# Patient Record
Sex: Male | Born: 1980 | Hispanic: Refuse to answer | Marital: Married | Smoking: Former smoker
Health system: Southern US, Community
[De-identification: ages and names within clinical notes are randomized; demographics above are authoritative.]

## PROBLEM LIST (undated history)

## (undated) DIAGNOSIS — F32A Depression, unspecified: Secondary | ICD-10-CM

## (undated) DIAGNOSIS — F4310 Post-traumatic stress disorder, unspecified: Secondary | ICD-10-CM

## (undated) DIAGNOSIS — D6851 Activated protein C resistance: Secondary | ICD-10-CM

## (undated) DIAGNOSIS — E669 Obesity, unspecified: Secondary | ICD-10-CM

## (undated) DIAGNOSIS — E785 Hyperlipidemia, unspecified: Secondary | ICD-10-CM

## (undated) DIAGNOSIS — F909 Attention-deficit hyperactivity disorder, unspecified type: Secondary | ICD-10-CM

## (undated) DIAGNOSIS — G4482 Headache associated with sexual activity: Secondary | ICD-10-CM

## (undated) HISTORY — DX: Depression, unspecified: F32.A

## (undated) HISTORY — DX: Activated protein C resistance: D68.51

## (undated) HISTORY — DX: Obesity, unspecified: E66.9

## (undated) HISTORY — DX: Headache associated with sexual activity: G44.82

## (undated) HISTORY — DX: Post-traumatic stress disorder, unspecified: F43.10

## (undated) HISTORY — DX: Hyperlipidemia, unspecified: E78.5

## (undated) HISTORY — DX: Attention-deficit hyperactivity disorder, unspecified type: F90.9

---

## 2011-07-14 ENCOUNTER — Encounter: Payer: Self-pay | Admitting: Internal Medicine

## 2011-07-14 ENCOUNTER — Other Ambulatory Visit: Payer: Self-pay | Admitting: Internal Medicine

## 2011-07-14 ENCOUNTER — Ambulatory Visit (INDEPENDENT_AMBULATORY_CARE_PROVIDER_SITE_OTHER): Payer: PRIVATE HEALTH INSURANCE | Admitting: Internal Medicine

## 2011-07-14 ENCOUNTER — Other Ambulatory Visit (INDEPENDENT_AMBULATORY_CARE_PROVIDER_SITE_OTHER): Payer: PRIVATE HEALTH INSURANCE

## 2011-07-14 VITALS — BP 130/80 | HR 72 | Temp 98.6°F | Resp 16 | Ht 68.5 in | Wt 351.0 lb

## 2011-07-14 DIAGNOSIS — D6851 Activated protein C resistance: Secondary | ICD-10-CM

## 2011-07-14 DIAGNOSIS — Z136 Encounter for screening for cardiovascular disorders: Secondary | ICD-10-CM | POA: Insufficient documentation

## 2011-07-14 DIAGNOSIS — E669 Obesity, unspecified: Secondary | ICD-10-CM

## 2011-07-14 DIAGNOSIS — R0683 Snoring: Secondary | ICD-10-CM

## 2011-07-14 DIAGNOSIS — Z0000 Encounter for general adult medical examination without abnormal findings: Secondary | ICD-10-CM

## 2011-07-14 DIAGNOSIS — D6859 Other primary thrombophilia: Secondary | ICD-10-CM

## 2011-07-14 LAB — COMPREHENSIVE METABOLIC PANEL
ALT: 53 U/L (ref 0–53)
AST: 32 U/L (ref 0–37)
CO2: 29 mEq/L (ref 19–32)
GFR: 124.34 mL/min (ref >60.00)
Sodium: 140 mEq/L (ref 135–145)
Total Bilirubin: 0.7 mg/dL (ref 0.3–1.2)
Total Protein: 7.6 g/dL (ref 6.0–8.3)

## 2011-07-14 LAB — CBC WITH DIFFERENTIAL/PLATELET
Basophils Relative: 0.8 % (ref 0.0–3.0)
Eosinophils Relative: 0.8 % (ref 0.0–5.0)
HCT: 42.9 % (ref 39.0–52.0)
Lymphs Abs: 3 10*3/uL (ref 0.7–4.0)
MCV: 88.8 fl (ref 78.0–100.0)
Monocytes Absolute: 0.7 10*3/uL (ref 0.1–1.0)
Neutrophils Relative %: 50.9 % (ref 43.0–77.0)
RBC: 4.83 Mil/uL (ref 4.22–5.81)
WBC: 7.7 10*3/uL (ref 4.5–10.5)

## 2011-07-14 LAB — HEMOGLOBIN A1C: Hgb A1c MFr Bld: 5.6 % (ref 4.6–6.5)

## 2011-07-14 LAB — LIPID PANEL
Total CHOL/HDL Ratio: 4
Triglycerides: 373 mg/dL — ABNORMAL HIGH (ref 0.0–149.0)

## 2011-07-14 NOTE — Progress Notes (Signed)
Subjective:    Patient ID: Luis Bowen, male    DOB: 04/24/1981, 30 y.o.   MRN: [id number]  HPI  New to me for a complete physical but he also complains of heavy snoring, weight gain, chronic headache, and "narcolepsy". He wants to be tested for Factor 5 leiden due to his mother's history.  Review of Systems  Constitutional: Positive for fatigue and unexpected weight change (weight gain). Negative for fever, chills, diaphoresis, activity change and appetite change.  HENT: Negative for sore throat, facial swelling, trouble swallowing, neck pain, neck stiffness and voice change.   Eyes: Negative for photophobia, redness and visual disturbance.  Respiratory: Positive for apnea. Negative for cough, choking, chest tightness, shortness of breath, wheezing and stridor.   Cardiovascular: Negative for chest pain, palpitations and leg swelling.  Gastrointestinal: Negative for nausea, abdominal pain, diarrhea, constipation, blood in stool, abdominal distention, anal bleeding and rectal pain.  Genitourinary: Negative for dysuria, urgency, frequency, hematuria, flank pain, decreased urine volume, enuresis and difficulty urinating.  Musculoskeletal: Negative for myalgias, back pain, joint swelling, arthralgias and gait problem.  Skin: Negative for color change, pallor and rash.  Neurological: Negative for dizziness, tremors, seizures, syncope, facial asymmetry, speech difficulty, weakness, light-headedness, numbness and headaches.  Hematological: Negative for adenopathy. Does not bruise/bleed easily.  Psychiatric/Behavioral: Negative for suicidal ideas, hallucinations, behavioral problems, confusion, sleep disturbance, self-injury, dysphoric mood, decreased concentration and agitation. The patient is not nervous/anxious and is not hyperactive.        Objective:   Physical Exam  Vitals reviewed. Constitutional: He is oriented to person, place, and time. He appears well-developed and well-nourished. No  distress.  HENT:  Head: Normocephalic and atraumatic.  Right Ear: External ear normal.  Left Ear: External ear normal.  Nose: Nose normal.  Mouth/Throat: Oropharynx is clear and moist. No oropharyngeal exudate.  Eyes: Conjunctivae and EOM are normal. Pupils are equal, round, and reactive to light. Right eye exhibits no discharge. Left eye exhibits no discharge. No scleral icterus.  Neck: Normal range of motion. Neck supple. No JVD present. No tracheal deviation present. No thyromegaly present.  Cardiovascular: Normal rate, regular rhythm, normal heart sounds and intact distal pulses.  Exam reveals no gallop and no friction rub.   No murmur heard. Pulmonary/Chest: Effort normal and breath sounds normal. No stridor. No respiratory distress. He has no wheezes. He has no rales. He exhibits no tenderness.  Abdominal: Soft. Bowel sounds are normal. He exhibits no distension and no mass. There is no tenderness. There is no rebound and no guarding. Hernia confirmed negative in the right inguinal area.  Genitourinary: Testes normal and penis normal. Right testis shows no mass, no swelling and no tenderness. Right testis is descended. Cremasteric reflex is not absent on the right side. Left testis shows no mass, no swelling and no tenderness. Left testis is descended. Cremasteric reflex is not absent on the left side. Circumcised. No penile erythema or penile tenderness. No discharge found.  Musculoskeletal: Normal range of motion. He exhibits no edema and no tenderness.  Lymphadenopathy:    He has no cervical adenopathy.       Right: No inguinal adenopathy present.       Left: No inguinal adenopathy present.  Neurological: He is alert and oriented to person, place, and time. He has normal reflexes. He displays normal reflexes. No cranial nerve deficit. He exhibits normal muscle tone. Coordination normal.  Skin: Skin is warm and dry. No rash noted. He is not diaphoretic. No erythema. No  pallor.    Psychiatric: He has a normal mood and affect. His behavior is normal. Judgment and thought content normal.          Assessment & Plan:

## 2011-07-14 NOTE — Patient Instructions (Signed)
Health Maintenance in Males MAINTAIN REGULAR HEALTH EXAMS  Maintain a healthy diet and normal weight. Increased weight leads to problems with blood pressure and diabetes. Decrease fat in the diet and increase exercise. Obtain a proper diet from your caregiver if necessary.   High blood pressure causes heart and blood vessel problems. Check blood pressures regularly and keep your blood pressure at normal limits. Aerobic exercise helps this. Persistent elevations of blood pressure should be treated with medications if weight loss and exercise are ineffective.   Avoid smoking, drinking in excess (more than 2 drinks per day), or use of street drugs. Do not share needles with anyone. Ask for help if you need assistance or instructions on stopping the use of alcohol, cigarettes, or drugs.   Maintain normal blood lipids and cholesterol. Your caregiver can give you information to lower your risk of heart disease or stroke.   Ask your caregiver if you are in need of early heart disease screening because of a strong family history of heart disease or signs of elevated testosterone (male sex hormone) levels. These can predispose you to early heart disease.   Practice safe sex. Practicing safe sex decreases your risk for a sexually transmitted infection (STI). Some of the STIs are gonorrhea, chlamydia, syphilis, trichimonas, herpes, human papillomavirus (HPV), and human immunodeficiency virus (HIV). Herpes, HIV, and HPV are viral illnesses that have no cure. These can result in disability, cancer, and death.   It is not safe for someone who has AIDS or is HIV positive to have unprotected sex with a partner who is HIV positive. The reason for this is the fact that there are many different strains of HIV. If you have a strain that is readily treated with medications and then suddenly introduce a strain from a partner that has no further treatment options, you may suddenly have a strain of HIV that is untreatable.  Even if you are both positive for HIV, it is still necessary to practice safe sex.   Use sunscreen with a SPF of 15 or greater. Being outside in the sun when your shadow caused by the sun is shorter than you are, means you are being exposed to sun at greater intensity. Lighter skinned people are at a greater risk of skin cancer.   Keep carbon monoxide and smoke detectors in your home and functioning at all times. Change the batteries every 6 months.   Do monthly examinations of your testicles. The best time to do this is after a hot shower or bath when the tissues are loose. Notify your caregivers of any lumps, tenderness, or changes in size or shape.   Notify your caregiver of new moles or changes in moles, especially if there is a change in shape or color. Also notify your caregiver if a mole is larger than the size of a pencil eraser.   Stay current with your tetanus shots and other required immunizations.  The Body Mass Index (BMI) is a way of measuring how much of your body is fat. Having a BMI above 27 increases the risk of heart disease, diabetes, hypertension, stroke, and other problems related to obesity. Document Released: 05/11/2008 Document Re-Released: 05/03/2010 [employer] Patient Information 2011 [employer]. 

## 2011-07-14 NOTE — Assessment & Plan Note (Signed)
Test ordered

## 2011-07-14 NOTE — Assessment & Plan Note (Signed)
He has been referred for sleep eval

## 2011-07-14 NOTE — Assessment & Plan Note (Signed)
Exam done, labs ordered, EKG is normal, pt ed material was given

## 2011-07-17 ENCOUNTER — Encounter: Payer: Self-pay | Admitting: Internal Medicine

## 2011-07-19 LAB — FACTOR 5 LEIDEN

## 2011-07-20 ENCOUNTER — Encounter: Payer: Self-pay | Admitting: Internal Medicine

## 2011-08-04 ENCOUNTER — Encounter: Payer: Self-pay | Admitting: Pulmonary Disease

## 2011-08-04 ENCOUNTER — Ambulatory Visit (INDEPENDENT_AMBULATORY_CARE_PROVIDER_SITE_OTHER): Payer: PRIVATE HEALTH INSURANCE | Admitting: Pulmonary Disease

## 2011-08-04 DIAGNOSIS — G4710 Hypersomnia, unspecified: Secondary | ICD-10-CM

## 2011-08-04 DIAGNOSIS — R0989 Other specified symptoms and signs involving the circulatory and respiratory systems: Secondary | ICD-10-CM

## 2011-08-04 DIAGNOSIS — R0683 Snoring: Secondary | ICD-10-CM

## 2011-08-04 DIAGNOSIS — R0609 Other forms of dyspnea: Secondary | ICD-10-CM

## 2011-08-04 NOTE — Patient Instructions (Signed)
Will set up for sleep study.  Will call once results available so we can review. Work on weight reduction.

## 2011-08-04 NOTE — Assessment & Plan Note (Signed)
The patient's history is classic for significant obstructive sleep apnea.  He has loud snoring with an abnormal breathing pattern, non-restorative sleep, and finally very significant daytime sleepiness.  He is obese and has abnormal upper airway anatomy.  I have had a long discussion with the pt about sleep apnea, including its impact on QOL and CV health.  The patient needs a sleep study at this point, and he is agreeable.  I have also encouraged him to work aggressively on weight loss.

## 2011-08-04 NOTE — Progress Notes (Signed)
  Subjective:    Patient ID: Luis Bowen, male    DOB: [birth year], 30 y.o.   MRN: [id number]  HPI The pt is a 30y/o male who is referred for evaluation of possible osa.  The patient's primary complaint is that of significant daytime hypersomnia.  He has been noted to have loud snoring while sleeping, as well as an abnormal breathing pattern.  He has frequent awakenings at night, and nonrestorative sleep.  The patient states that he has significant sleep pressure during the day with any period of inactivity.  He is falling asleep at work, and his job performance is being affected.  Patient also notes he will fall asleep easily while watching TV or movies in the evening, and has significant sleepiness while driving.  He states his weight is up about 20 pounds over the last 2 years, and his Epworth sleepiness score today is 24.  Sleep Questionnaire: What time do you typically go to bed?( Between what hours) 9 to 10 pm How long does it take you to fall asleep? 1 to 2 mins How many times during the night do you wake up? 1 What time do you get out of bed to start your day? 0600 Do you drive or operate heavy machinery in your occupation? No How much has your weight changed (up or down) over the past two years? (In pounds) 20 lb (9.072 kg) Have you ever had a sleep study before? No Do you currently use CPAP? No Do you wear oxygen at any time? No    Review of Systems  Constitutional: Negative for fever and unexpected weight change.  HENT: Negative for ear pain, nosebleeds, congestion, sore throat, rhinorrhea, sneezing, trouble swallowing, dental problem, postnasal drip and sinus pressure.   Eyes: Negative for redness and itching.  Respiratory: Negative for cough, chest tightness, shortness of breath and wheezing.   Cardiovascular: Negative for palpitations and leg swelling.  Gastrointestinal: Negative for nausea and vomiting.  Genitourinary: Negative for dysuria.  Musculoskeletal: Negative for joint  swelling.  Skin: Negative for rash.  Neurological: Negative for headaches.  Hematological: Does not bruise/bleed easily.  Psychiatric/Behavioral: Negative for dysphoric mood. The patient is nervous/anxious.        Objective:   Physical Exam Constitutional:  Obese male, no acute distress  HENT:  Nares patent without discharge, but enlarged turbs.  Oropharynx without exudate, palate and uvula are thick and elongated.  Significant tonsillar hypertrophy.   Eyes:  Perrla, eomi, no scleral icterus  Neck:  No JVD, no TMG  Cardiovascular:  Normal rate, regular rhythm, no rubs or gallops.  No murmurs        Intact distal pulses  Pulmonary :  Normal breath sounds, no stridor or respiratory distress   No rales, rhonchi, or wheezing  Abdominal:  Soft, nondistended, bowel sounds present.  No tenderness noted.   Musculoskeletal:  mild lower extremity edema noted.  Lymph Nodes:  No cervical lymphadenopathy noted  Skin:  No cyanosis noted  Neurologic:  Alert, appropriate, moves all 4 extremities without obvious deficit.         Assessment & Plan:

## 2011-08-08 ENCOUNTER — Ambulatory Visit (INDEPENDENT_AMBULATORY_CARE_PROVIDER_SITE_OTHER): Payer: PRIVATE HEALTH INSURANCE | Admitting: Pulmonary Disease

## 2011-08-08 DIAGNOSIS — G4710 Hypersomnia, unspecified: Secondary | ICD-10-CM

## 2011-08-08 DIAGNOSIS — R0683 Snoring: Secondary | ICD-10-CM

## 2011-08-08 DIAGNOSIS — G4733 Obstructive sleep apnea (adult) (pediatric): Secondary | ICD-10-CM

## 2011-08-14 ENCOUNTER — Encounter: Payer: Self-pay | Admitting: Internal Medicine

## 2011-08-14 ENCOUNTER — Ambulatory Visit (INDEPENDENT_AMBULATORY_CARE_PROVIDER_SITE_OTHER): Payer: PRIVATE HEALTH INSURANCE | Admitting: Internal Medicine

## 2011-08-14 VITALS — BP 118/76 | HR 64 | Temp 98.4°F | Resp 16 | Wt 350.0 lb

## 2011-08-14 DIAGNOSIS — D6859 Other primary thrombophilia: Secondary | ICD-10-CM

## 2011-08-14 DIAGNOSIS — E781 Pure hyperglyceridemia: Secondary | ICD-10-CM | POA: Insufficient documentation

## 2011-08-14 DIAGNOSIS — D6851 Activated protein C resistance: Secondary | ICD-10-CM

## 2011-08-14 MED ORDER — FENOFIBRATE 150 MG PO CAPS: 1.0000 | ORAL_CAPSULE | Freq: Every day | ORAL | Status: DC

## 2011-08-14 NOTE — Assessment & Plan Note (Signed)
Start lipofen and continue to work on lifestyle modifications

## 2011-08-14 NOTE — Patient Instructions (Signed)
Hypertriglyceridemia    Diet for High blood levels of Triglycerides  Most fats in food are triglycerides. Triglycerides in your blood are stored as fat in your body. High levels of triglycerides in your blood may put you at a greater risk for heart disease and stroke.    Normal triglyceride levels are less than 150 mg/dL. Borderline high levels are 150-199 mg/dl. High levels are 200 - 499 mg/dL, and very high triglyceride levels are greater than 500 mg/dL. The decision to treat high triglycerides is generally based on the level. For people with borderline or high triglyceride levels, treatment includes weight loss and exercise. Drugs are recommended for people with very high triglyceride levels.  Many people who need treatment for high triglyceride levels have metabolic syndrome. This syndrome is a collection of disorders that often include: insulin resistance, high blood pressure, blood clotting problems, high cholesterol and triglycerides.  TESTING PROCEDURE FOR TRIGLYCERIDES   You should not eat 4 hours before getting your triglycerides measured. The normal range of triglycerides is between 10 and 250 milligrams per deciliter (mg/dl). Some people may have extreme levels (1000 or above), but your triglyceride level may be too high if it is above 150 mg/dl, depending on what other risk factors you have for heart disease.    People with high blood triglycerides may also have high blood cholesterol levels. If you have high blood cholesterol as well as high blood triglycerides, your risk for heart disease is probably greater than if you only had high triglycerides. High blood cholesterol is one of the main risk factors for heart disease.   CHANGING YOUR DIET     Your weight can affect your blood triglyceride level. If you are more than 20% above your ideal body weight, you may be able to lower your blood triglycerides by losing weight. Eating less and exercising regularly is the best way to combat this. Fat provides more calories than any other food. The best way to lose weight is to eat less fat. Only 30% of your total calories should come from fat. Less than 7% of your diet should come from saturated fat. A diet low in fat and saturated fat is the same as a diet to decrease blood cholesterol. By eating a diet lower in fat, you may lose weight, lower your blood cholesterol, and lower your blood triglyceride level.    Eating a diet low in fat, especially saturated fat, may also help you lower your blood triglyceride level. Ask your dietitian to help you figure how much fat you can eat based on the number of calories your caregiver has prescribed for you.    Exercise, in addition to helping with weight loss may also help lower triglyceride levels.    Alcohol can increase blood triglycerides. You may need to stop drinking alcoholic beverages.    Too much carbohydrate in your diet may also increase your blood triglycerides. Some complex carbohydrates are necessary in your diet. These may include bread, rice, potatoes, other starchy vegetables and cereals.    Reduce "simple" carbohydrates. These may include pure sugars, candy, honey, and jelly without losing other nutrients. If you have the kind of high blood triglycerides that is affected by the amount of carbohydrates in your diet, you will need to eat less sugar and less high-sugar foods. Your caregiver can help you with this.    Adding 2-4 grams of fish oil (EPA+ DHA) may also help lower triglycerides. Speak with your caregiver before adding any supplements to   your regimen.   Following the Diet    Maintain your ideal weight. Your caregivers can help you with a diet. Generally, eating less food and getting more exercise will help you lose weight. Joining a weight control group may also help. Ask your caregivers for a good weight control group in your area.    Eat low-fat foods instead of high-fat foods. This can help you lose weight too.    These foods are lower in fat. Eat MORE of these:    Dried beans, peas, and lentils.      Egg whites.      Low-fat cottage cheese.      Fish.     Lean cuts of meat, such as round, sirloin, rump, and flank (cut extra fat off meat you fix).     Whole grain breads, cereals and pasta.      Skim and nonfat dry milk.      Low-fat yogurt.      Poultry without the skin.      Cheese made with skim or part-skim milk, such as mozzarella, parmesan, farmers', ricotta, or pot cheese.      These are higher fat foods. Eat LESS of these:    Whole milk and foods made from whole milk, such as American, blue, cheddar, monterey jack, and swiss cheese    High-fat meats, such as luncheon meats, sausages, knockwurst, bratwurst, hot dogs, ribs, corned beef, ground pork, and regular ground beef.    Fried foods.   Limit saturated fats in your diet. Substituting unsaturated fat for saturated fat may decrease your blood triglyceride level. You will need to read package labels to know which products contain saturated fats.    These foods are high in saturated fat. Eat LESS of these:    Fried pork skins.    Whole milk.      Skin and fat from poultry.      Palm oil.      Butter.     Shortening.     Cream cheese.      Bacon.     Margarines and baked goods made from listed oils.      Vegetable shortenings.     Chitterlings.    Fat from meats.      Coconut oil.      Palm kernel oil.      Lard.     Cream.     Sour cream.      Fatback.     Coffee whiteners and non-dairy creamers made with these oils.      Cheese made from whole milk.       Use unsaturated fats (both polyunsaturated and monounsaturated) moderately. Remember, even though unsaturated fats are better than saturated fats; you still want a diet low in total fat.    These foods are high in unsaturated fat:    Canola oil.    Sunflower oil.      Mayonnaise.     Almonds.     Peanuts.     Pine nuts.      Margarines made with these oils.     Safflower oil.    Olive oil.      Avocados.     Cashews.     Peanut butter.      Sunflower seeds.     Soybean oil.    Peanut oil.      Olives.     Pecans.     Walnuts.     Pumpkin seeds.        Avoid sugar and other high-sugar foods. This will decrease carbohydrates without decreasing other nutrients. Sugar in your food goes rapidly to your blood. When there is excess sugar in your blood, your liver may use it to make more triglycerides. Sugar also contains calories without other important nutrients.    Eat LESS of these:    Sugar, brown sugar, powdered sugar, jam, jelly, preserves, honey, syrup, molasses, pies, candy, cakes, cookies, frosting, pastries, colas, soft drinks, punches, fruit drinks, and regular gelatin.    Avoid alcohol. Alcohol, even more than sugar, may increase blood triglycerides. In addition, alcohol is high in calories and low in nutrients. Ask for sparkling water, or a diet soft drink instead of an alcoholic beverage.   Suggestions for planning and preparing meals    Bake, broil, grill or roast meats instead of frying.    Remove fat from meats and skin from poultry before cooking.    Add spices, herbs, lemon juice or vinegar to vegetables instead of salt, rich sauces or gravies.    Use a non-stick skillet without fat or use no-stick sprays.    Cool and refrigerate stews and broth. Then remove the hardened fat floating on the surface before serving.    Refrigerate meat drippings and skim off fat to make low-fat gravies.    Serve more fish.     Use less butter, margarine and other high-fat spreads on bread or vegetables.    Use skim or reconstituted non-fat dry milk for cooking.    Cook with low-fat cheeses.    Substitute low-fat yogurt or cottage cheese for all or part of the sour cream in recipes for sauces, dips or congealed salads.    Use half yogurt/half mayonnaise in salad recipes.    Substitute evaporated skim milk for cream. Evaporated skim milk or reconstituted non-fat dry milk can be whipped and substituted for whipped cream in certain recipes.    Choose fresh fruits for dessert instead of high-fat foods such as pies or cakes. Fruits are naturally low in fat.   When Dining Out    Order low-fat appetizers such as fruit or vegetable juice, pasta with vegetables or tomato sauce.    Select clear, rather than cream soups.    Ask that dressings and gravies be served on the side. Then use less of them.    Order foods that are baked, broiled, poached, steamed, stir-fried, or roasted.    Ask for margarine instead of butter, and use only a small amount.    Drink sparkling water, unsweetened tea or coffee, or diet soft drinks instead of alcohol or other sweet beverages.   QUESTIONS AND ANSWERS ABOUT OTHER FATS IN THE BLOOD:   SATURATED FAT, TRANS FAT, AND CHOLESTEROL  What is trans fat?  Trans fat is a type of fat that is formed when vegetable oil is hardened through a process called hydrogenation. This process helps makes foods more solid, gives them shape, and prolongs their shelf life. Trans fats are also called hydrogenated or partially hydrogenated oils.    What do saturated fat, trans fat, and cholesterol in foods have to do with heart disease?  Saturated fat, trans fat, and cholesterol in the diet all raise the level of LDL "bad" cholesterol in the blood. The higher the LDL cholesterol, the greater the risk for coronary heart disease (CHD). Saturated fat and trans fat raise LDL similarly.     What foods contain saturated fat, trans fat, and cholesterol?  High amounts of saturated fat   are found in animal products, such as fatty cuts of meat, chicken skin, and full-fat dairy products like butter, whole milk, cream, and cheese, and in tropical vegetable oils such as palm, palm kernel, and coconut oil. Trans fat is found in some of the same foods as saturated fat, such as vegetable shortening, some margarines (especially hard or stick margarine), crackers, cookies, baked goods, fried foods, salad dressings, and other processed foods made with partially hydrogenated vegetable oils. Small amounts of trans fat also occur naturally in some animal products, such as milk products, beef, and lamb. Foods high in cholesterol include liver, other organ meats, egg yolks, shrimp, and full-fat dairy products.  How can I use the new food label to make heart-healthy food choices?  Check the Nutrition Facts panel of the food label. Choose foods lower in saturated fat, trans fat, and cholesterol. For saturated fat and cholesterol, you can also use the Percent Daily Value (%DV): 5% DV or less is low, and 20% DV or more is high. (There is no %DV for trans fat.) Use the Nutrition Facts panel to choose foods low in saturated fat and cholesterol, and if the trans fat is not listed, read the ingredients and limit products that list shortening or hydrogenated or partially hydrogenated vegetable oil, which tend to be high in trans fat.  POINTS TO REMEMBER: YOU NEED A LITTLE TLC (THERAPEUTIC LIFESTYLE CHANGES)   Discuss your risk for heart disease with your caregivers, and take steps to reduce risk factors.    Change your diet. Choose foods that are low in saturated fat, trans fat, and cholesterol.     Add exercise to your daily routine if it is not already being done. Participate in physical activity of moderate intensity, like brisk walking, for at least 30 minutes on most, and preferably all days of the week. No time? Break the 30 minutes into three, 10-minute segments during the day.    Stop smoking. If you do smoke, contact your caregiver to discuss ways in which they can help you quit.    Do not use street drugs.    Maintain a normal weight.    Maintain a healthy blood pressure.    Keep up with your blood work for checking the fats in your blood as directed by your caregiver.   Document Released: 08/31/2004 Document Re-Released: 05/03/2010  [employer] Patient Information 2011 [employer].

## 2011-08-14 NOTE — Progress Notes (Signed)
  Subjective:    Patient ID: Luis Bowen, male    DOB: Apr 03, 1981, 30 y.o.   MRN: [id number]  Hyperlipidemia This is a new problem. This is a new diagnosis. The problem is uncontrolled. Recent lipid tests were reviewed and are variable. Exacerbating diseases include obesity. He has no history of chronic renal disease, diabetes, hypothyroidism, liver disease or nephrotic syndrome. Factors aggravating his hyperlipidemia include fatty foods. Pertinent negatives include no chest pain, focal sensory loss, focal weakness, leg pain, myalgias or shortness of breath. Current antihyperlipidemic treatment includes exercise and diet change. There are no compliance problems.       Review of Systems  Constitutional: Negative.   HENT: Negative.   Eyes: Negative.   Respiratory: Negative.  Negative for shortness of breath.   Cardiovascular: Negative.  Negative for chest pain.  Gastrointestinal: Negative.   Genitourinary: Negative.   Musculoskeletal: Negative.  Negative for myalgias.  Skin: Negative.   Neurological: Negative.  Negative for focal weakness.  Hematological: Negative.   Psychiatric/Behavioral: Negative.        Objective:   Physical Exam  Vitals reviewed. Constitutional: He is oriented to person, place, and time. He appears well-developed and well-nourished. No distress.  HENT:  Mouth/Throat: Oropharynx is clear and moist. No oropharyngeal exudate.  Eyes: Conjunctivae are normal. Right eye exhibits no discharge. Left eye exhibits no discharge. No scleral icterus.  Neck: Normal range of motion. Neck supple. No JVD present. No tracheal deviation present. No thyromegaly present.  Cardiovascular: Normal rate, regular rhythm, normal heart sounds and intact distal pulses.  Exam reveals no gallop and no friction rub.   No murmur heard. Pulmonary/Chest: Effort normal and breath sounds normal. No stridor. No respiratory distress. He has no wheezes. He has no rales. He exhibits no tenderness.    Abdominal: Soft. Bowel sounds are normal. He exhibits no distension and no mass. There is no tenderness. There is no rebound and no guarding.  Musculoskeletal: Normal range of motion. He exhibits no edema and no tenderness.  Lymphadenopathy:    He has no cervical adenopathy.  Neurological: He is oriented to person, place, and time. He displays normal reflexes. No cranial nerve deficit. He exhibits normal muscle tone. Coordination normal.  Skin: Skin is warm and dry. No rash noted. He is not diaphoretic. No erythema. No pallor.  Psychiatric: He has a normal mood and affect. His behavior is normal. Judgment and thought content normal.      Lab Results  Component Value Date   WBC 7.7 07/14/2011   HGB 14.6 07/14/2011   HCT 42.9 07/14/2011   PLT 214.0 07/14/2011   CHOL 156 07/14/2011   TRIG 373.0* 07/14/2011   HDL 36.80* 07/14/2011   LDLDIRECT 75.7 07/14/2011   ALT 53 07/14/2011   AST 32 07/14/2011   NA 140 07/14/2011   K 4.4 07/14/2011   CL 105 07/14/2011   CREATININE 0.8 07/14/2011   BUN 11 07/14/2011   CO2 29 07/14/2011   TSH 0.79 07/14/2011   HGBA1C 5.6 07/14/2011      Assessment & Plan:

## 2011-08-18 NOTE — Progress Notes (Signed)
Addended by: Etta Grandchild on: 08/18/2011 11:11 AM   Modules accepted: Orders

## 2011-08-23 ENCOUNTER — Encounter: Payer: PRIVATE HEALTH INSURANCE | Admitting: Oncology

## 2011-09-20 ENCOUNTER — Other Ambulatory Visit: Payer: Self-pay | Admitting: Oncology

## 2011-09-20 ENCOUNTER — Encounter (HOSPITAL_BASED_OUTPATIENT_CLINIC_OR_DEPARTMENT_OTHER): Payer: PRIVATE HEALTH INSURANCE | Admitting: Oncology

## 2011-09-20 ENCOUNTER — Encounter: Payer: PRIVATE HEALTH INSURANCE | Admitting: Oncology

## 2011-09-20 DIAGNOSIS — D682 Hereditary deficiency of other clotting factors: Secondary | ICD-10-CM

## 2011-09-20 DIAGNOSIS — D689 Coagulation defect, unspecified: Secondary | ICD-10-CM

## 2011-10-16 ENCOUNTER — Ambulatory Visit: Payer: PRIVATE HEALTH INSURANCE | Admitting: Internal Medicine

## 2012-02-16 ENCOUNTER — Telehealth: Payer: Self-pay | Admitting: Internal Medicine

## 2012-05-29 ENCOUNTER — Emergency Department: Payer: Self-pay | Admitting: Emergency Medicine

## 2012-05-29 LAB — BASIC METABOLIC PANEL
Calcium, Total: 9.3 mg/dL (ref 8.5–10.1)
Co2: 29 mmol/L (ref 21–32)
Potassium: 4 mmol/L (ref 3.5–5.1)
Sodium: 137 mmol/L (ref 136–145)

## 2012-05-29 LAB — CBC
MCH: 30.3 pg (ref 26.0–34.0)
MCV: 88 fL (ref 80–100)
Platelet: 176 10*3/uL (ref 150–440)
RDW: 13.2 % (ref 11.5–14.5)

## 2012-05-29 LAB — PROTIME-INR
INR: 1
Prothrombin Time: 13.2 secs (ref 11.5–14.7)

## 2013-11-15 IMAGING — CT CT HEAD WITHOUT CONTRAST
1 series · 16 of 30 positions shown, 20 images · non-contrast
Comparison: none

REASON FOR EXAM: headache, syncope
COMMENTS:

PROCEDURE:     CT  - CT HEAD WITHOUT CONTRAST  - May 29, 2012  [DATE]
RESULT:     Comparison:  None
TECHNIQUE: Multiple axial images from the foramen magnum to the vertex were
obtained without IV contrast.

[Series 2: soft tissue · axial · 0.42mm/px · z∈[+74,+208]mm · 16 of 30 slices shown, 20 images]
[im 2/30  brain]
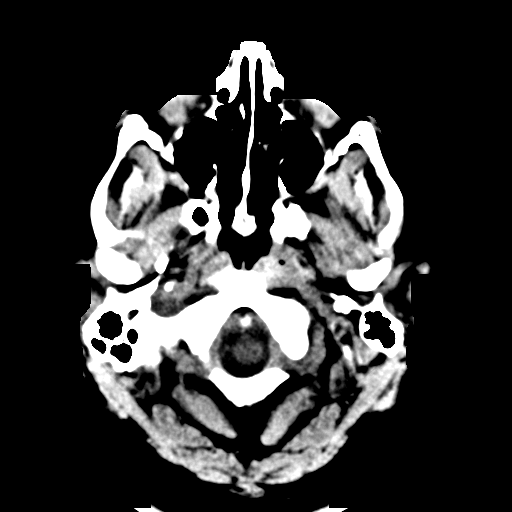
[im 2/30  bone]
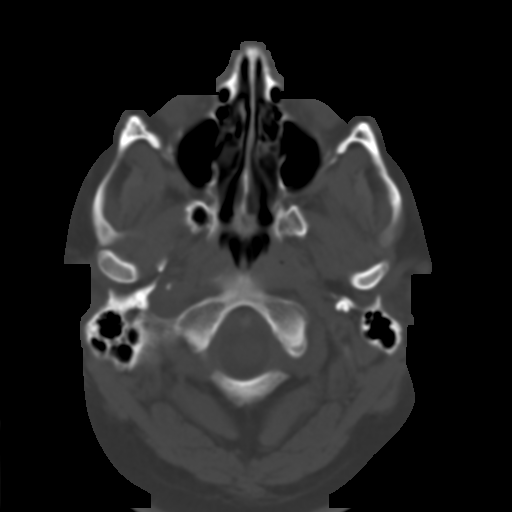
[im 4/30  brain]
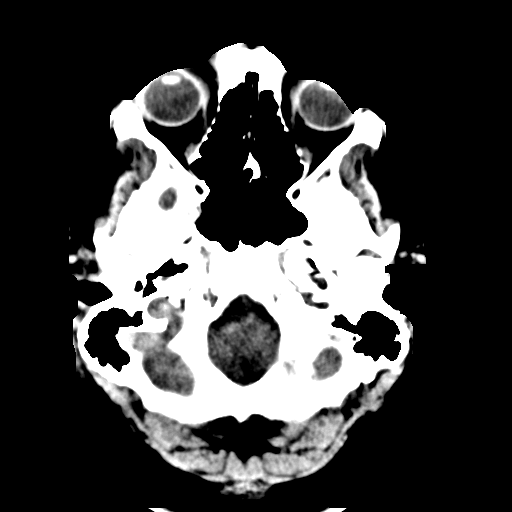
[im 6/30  brain]
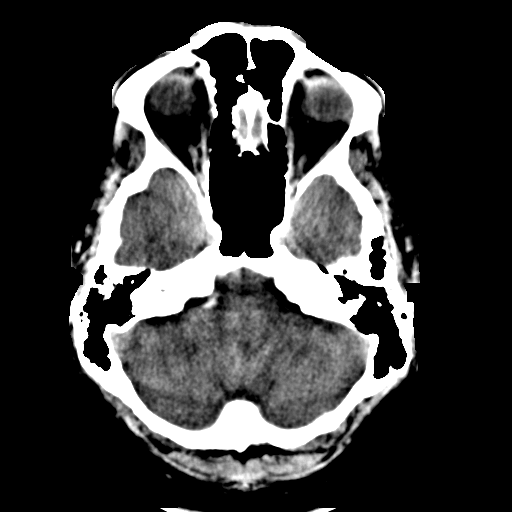
[im 8/30  brain]
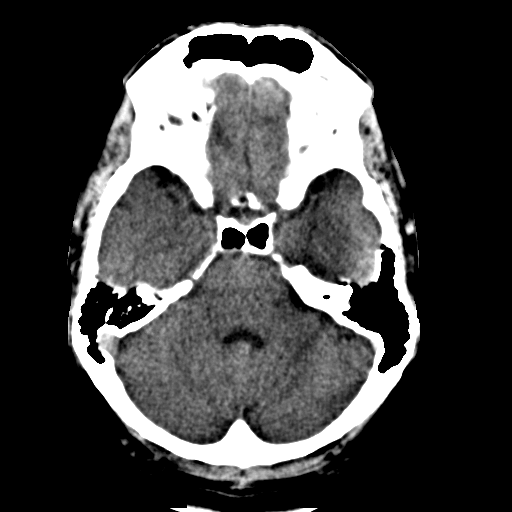
[im 9/30  brain]
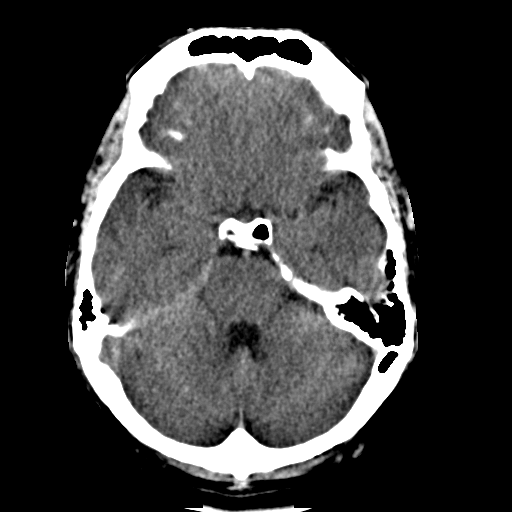
[im 9/30  bone]
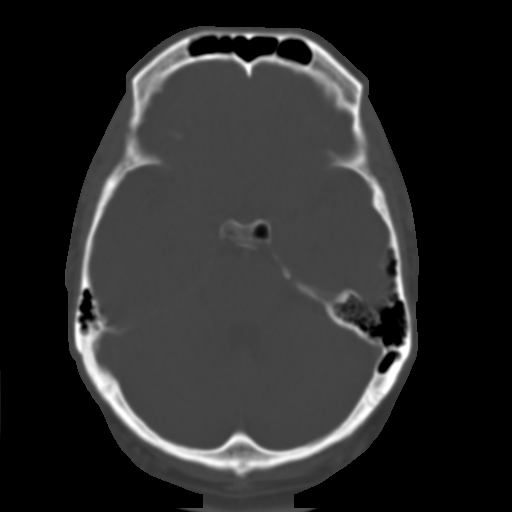
[im 11/30  brain]
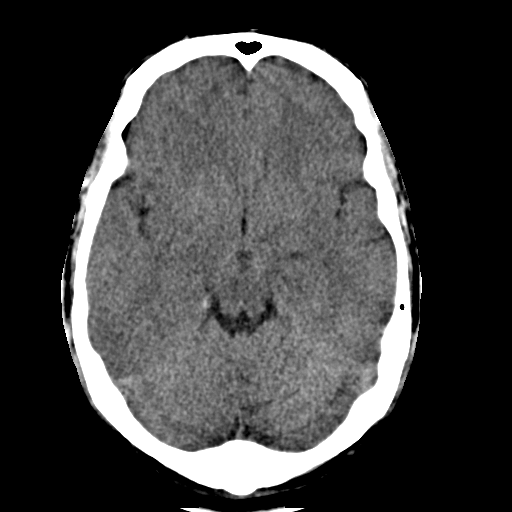
[im 13/30  brain]
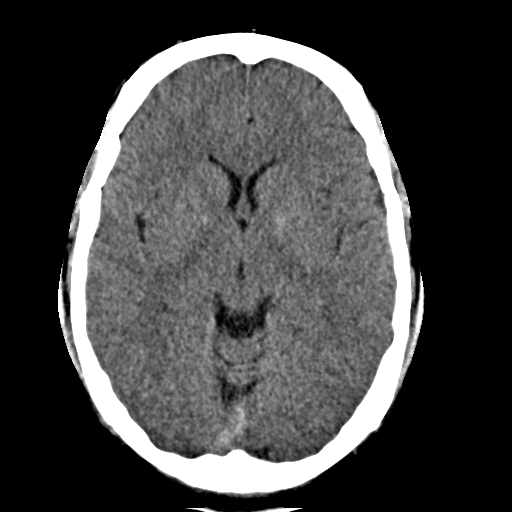
[im 15/30  brain]
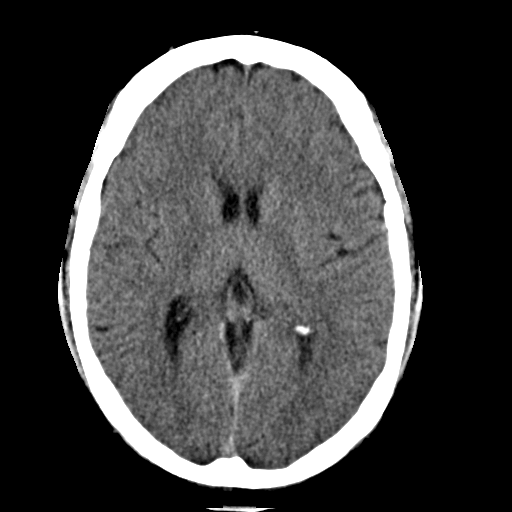
[im 16/30  brain]
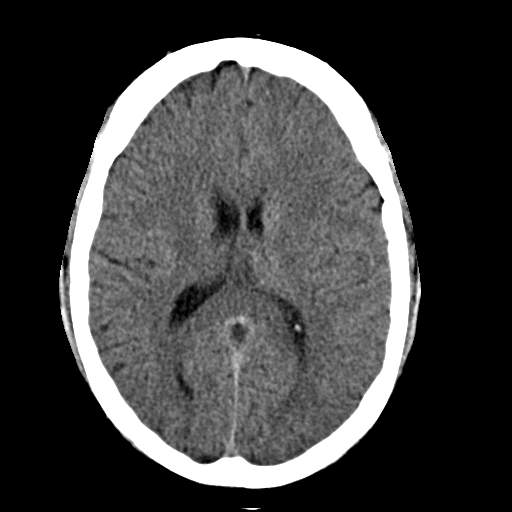
[im 16/30  bone]
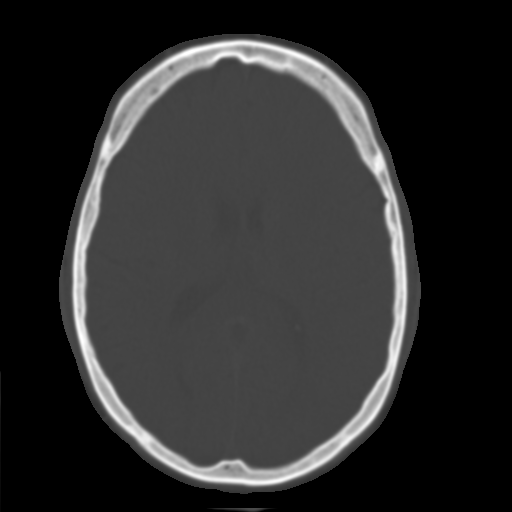
[im 18/30  brain]
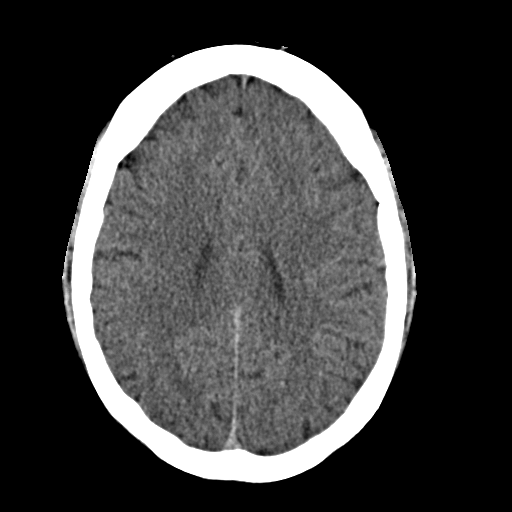
[im 20/30  brain]
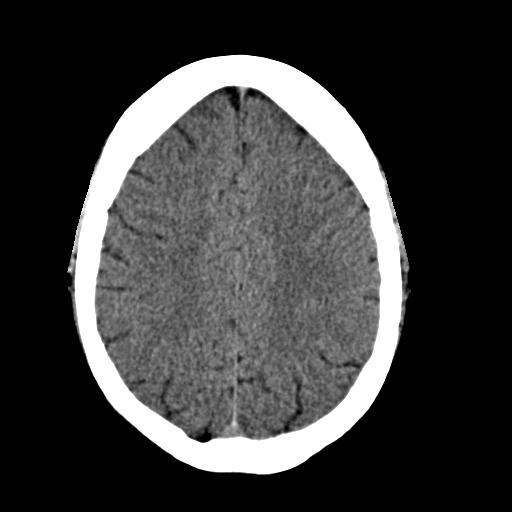
[im 22/30  brain]
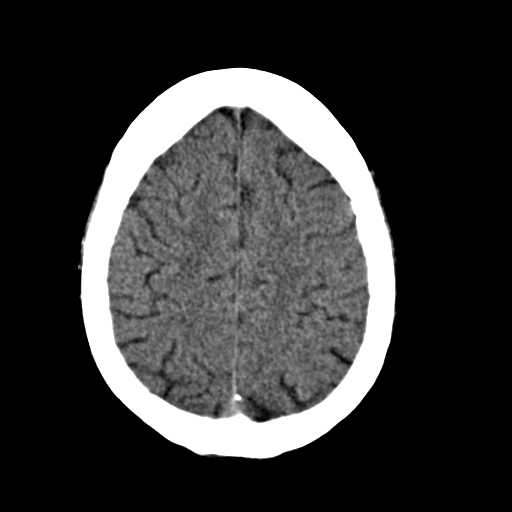
[im 23/30  brain]
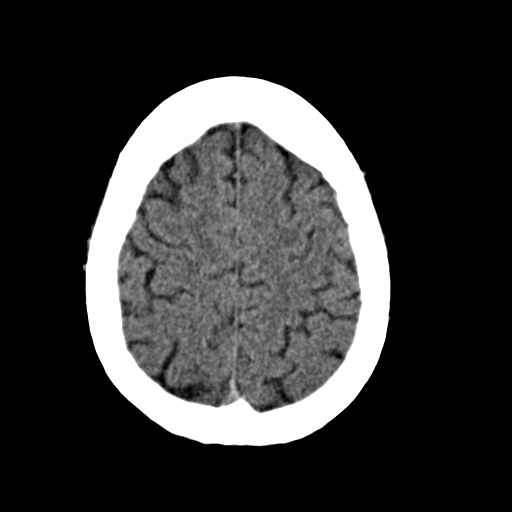
[im 23/30  bone]
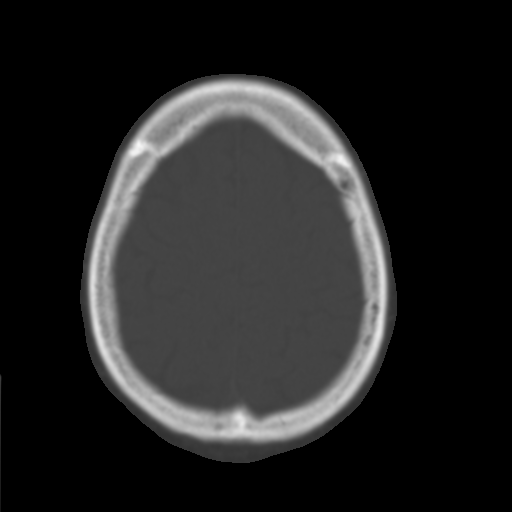
[im 25/30  brain]
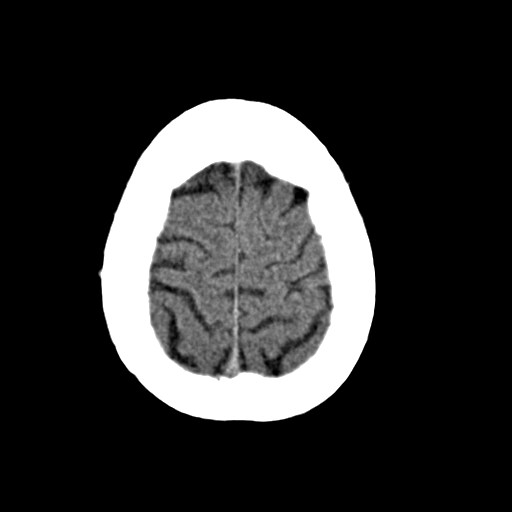
[im 27/30  brain]
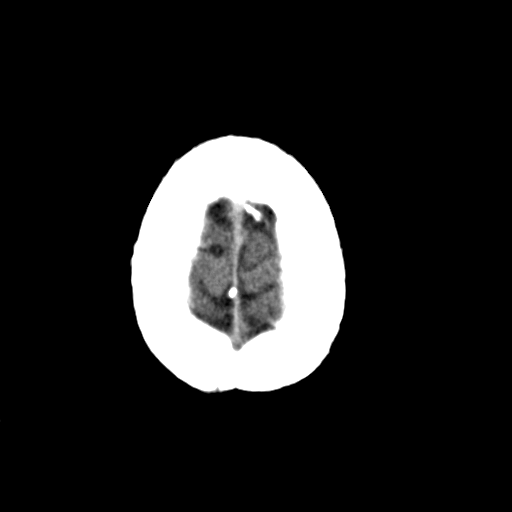
[im 29/30  brain]
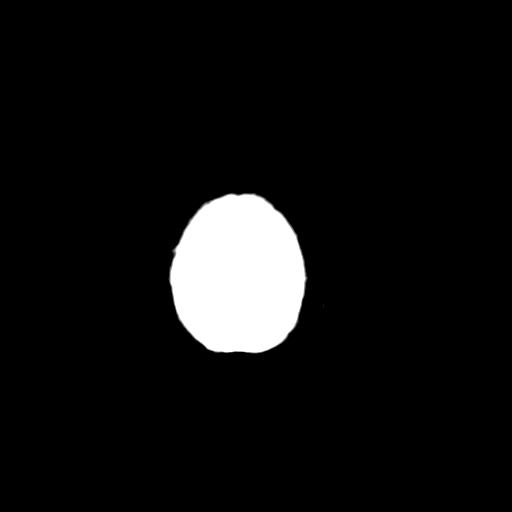

[16 of 30 positions shown; findings below may reference images not displayed]

FINDINGS: There is no evidence of mass effect, midline shift, or extra-axial fluid
collections.  There is no evidence of a space-occupying lesion or
intracranial hemorrhage. There is no evidence of a cortical-based area of
acute infarction.

The ventricles and sulci are appropriate for the patient's age. The basal
cisterns are patent.

Visualized portions of the orbits are unremarkable. The visualized portions
of the paranasal sinuses and mastoid air cells are unremarkable.

The osseous structures are unremarkable.
IMPRESSION: No acute intracranial process.

[REDACTED]

## 2014-03-20 ENCOUNTER — Encounter: Payer: Self-pay | Admitting: Neurology

## 2014-03-20 ENCOUNTER — Encounter (INDEPENDENT_AMBULATORY_CARE_PROVIDER_SITE_OTHER): Payer: Self-pay

## 2014-03-20 ENCOUNTER — Ambulatory Visit (INDEPENDENT_AMBULATORY_CARE_PROVIDER_SITE_OTHER): Payer: PRIVATE HEALTH INSURANCE | Admitting: Neurology

## 2014-03-20 VITALS — BP 131/86 | HR 77 | Ht 68.0 in | Wt 342.0 lb

## 2014-03-20 DIAGNOSIS — D6859 Other primary thrombophilia: Secondary | ICD-10-CM

## 2014-03-20 DIAGNOSIS — D6851 Activated protein C resistance: Secondary | ICD-10-CM

## 2014-03-20 DIAGNOSIS — G43009 Migraine without aura, not intractable, without status migrainosus: Secondary | ICD-10-CM

## 2014-03-20 DIAGNOSIS — G4482 Headache associated with sexual activity: Secondary | ICD-10-CM

## 2014-03-20 HISTORY — DX: Headache associated with sexual activity: G44.82

## 2014-03-20 MED ORDER — TOPIRAMATE 25 MG PO TABS: ORAL_TABLET | ORAL | Status: DC

## 2014-03-20 MED ORDER — RIZATRIPTAN BENZOATE 10 MG PO TABS: 10.0000 mg | ORAL_TABLET | Freq: Three times a day (TID) | ORAL | Status: DC | PRN

## 2014-03-20 NOTE — Progress Notes (Signed)
Reason for visit: Headache  Luis Bowen is a 33 y.o. male  History of present illness:  Mr. Luis Bowen is a 33 year old right-handed white male with a history of obesity, and a history of factor V Leiden mutation. The patient is on aspirin for this. The patient has begun having headaches in 2012. The patient had one headache that year, but every year since that time, he has had an increasing number of headaches. He indicates that the headaches generally will occur during allergy seasons, in the spring and fall. The patient has noted that the headache will usually begin during sexual intercourse, at the time of orgasm. The patient will have onset headache in the right frontoparietal area, with some spread frontally and occipitally at times. On one occasion, he has had a headache on the left side. The patient has had one event of syncope associated with the headache. Headaches may last 20-30 minutes, but some headaches have lasted much longer, up to 8 days. The patient will occasion had some nausea without vomiting, and he has reported streaks of colored lights in the vision. The patient recently has noted some numbness and tingly sensations in the hands at nighttime during sleep. The patient denies any true weakness, but does have some flushing at times. The patient denies problems controlling the bowels or the bladder. The patient has not had any neck discomfort. He comes to this office for further evaluation. The patient was thought to have a sinus infection, and he has been treated with antibiotics.  Past Medical History  Diagnosis Date  . Factor 5 Leiden mutation, heterozygous   . Hyperlipidemia   . Coital headache 03/20/2014  . Obese     History reviewed. No pertinent past surgical history.  Family History  Problem Relation Age of Onset  . Heart disease Maternal Grandmother   . Factor V Leiden deficiency Mother   . Diabetes Mother   . Cancer Maternal Grandfather     pancreatic  .  Emphysema Paternal Grandfather   . Migraines Neg Hx     Social history:  reports that he quit smoking about 10 years ago. His smoking use included Cigarettes. He has a .1 pack-year smoking history. He has never used smokeless tobacco. He reports that he drinks about 1.8 ounces of alcohol per week. He reports that he does not use illicit drugs.  Medications:  Current Outpatient Prescriptions on File Prior to Visit  Medication Sig Dispense Refill  . Multiple Vitamins-Minerals (MENS MULTIVITAMIN PLUS) TABS Take 1 tablet by mouth daily.         No current facility-administered medications on file prior to visit.      Allergies  Allergen Reactions  . Oxycodone Hcl Other (See Comments)    Hallucinations  . Vicodin [Hydrocodone-Acetaminophen]     Non reactive    ROS:  Out of a complete 14 system review of symptoms, the patient complains only of the following symptoms, and all other reviewed systems are negative. Chest pain Ringing in the ears Blurred vision, eye pain Shortness of breath Feeling hot Joint pain Allergies Headache, tremor Anxiety, decreased energy Insomnia, sleepiness, snoring, shift work Hematuria following heavy weight lifting  Blood pressure 131/86, pulse 77, height 5\' 8"  (1.727 m), weight 342 lb (155.13 kg).  Physical Exam  General: The patient is alert and cooperative at the time of the examination. The patient is markedly obese.  Eyes: Pupils are equal, round, and reactive to light. Discs are flat bilaterally.  Neck: The  neck is supple, no carotid bruits are noted.  Respiratory: The respiratory examination is clear.  Cardiovascular: The cardiovascular examination reveals a regular rate and rhythm, no obvious murmurs or rubs are noted.  Neuromuscular: Range of movement of the cervical spine is full. No crepitus is noted in the temporomandibular joints.  Skin: Extremities are with 1+ edema at the ankles bilaterally.  Neurologic Exam  Mental status:  The patient is alert and oriented x 3 at the time of the examination. The patient has apparent normal recent and remote memory, with an apparently normal attention span and concentration ability.  Cranial nerves: Facial symmetry is present. There is good sensation of the face to pinprick and soft touch bilaterally. The strength of the facial muscles and the muscles to head turning and shoulder shrug are normal bilaterally. Speech is well enunciated, no aphasia or dysarthria is noted. Extraocular movements are full. Visual fields are full. The tongue is midline, and the patient has symmetric elevation of the soft palate. No obvious hearing deficits are noted.  Motor: The motor testing reveals 5 over 5 strength of all 4 extremities. Good symmetric motor tone is noted throughout.  Sensory: Sensory testing is intact to pinprick, soft touch, vibration sensation, and position sense on all 4 extremities. No evidence of extinction is noted. Tinel sign at the wrists is mildly positive bilaterally.  Coordination: Cerebellar testing reveals good finger-nose-finger and heel-to-shin bilaterally.  Gait and station: Gait is normal. Tandem gait is normal. Romberg is negative. No drift is seen.  Reflexes: Deep tendon reflexes are symmetric, but are depressed bilaterally. Toes are downgoing bilaterally.   Assessment/Plan:  1. Coital headache versus migraine  2. Possible mild bilateral carpal tunnel syndrome  The patient gives a history of headache beginning during sexual intercourse, but the headaches are somewhat atypical for true coital headaches as they last 4 days at a time, may be associated with nausea, and the patient is having visual phenomenon such as streaks of colored vision during the headache. The headaches are unilateral. The patient may have migraine headache that is activated by allergies, and physical activity. The patient will require MRI evaluation of the brain to exclude entities such as  Arnold-Chiari malformation. MRA of the head will be done. The patient does have factor V Leiden mutation. The patient will followup in 4 months. He will be given a prescription for Topamax, and Maxalt to take if needed for the headache.  Marlan Palau. Keith Janine Reller MD 03/20/2014 2:16 PM  [facility] [address] [place], [place] [id number]  Phone [phone] Fax [phone]

## 2014-03-20 NOTE — Patient Instructions (Signed)
Topamax (topiramate) is a seizure medication that has an FDA approval for seizures and for migraine headache. Potential side effects of this medication include weight loss, cognitive slowing, tingling in the fingers and toes, and carbonated drinks will taste bad. If any significant side effects are noted on this drug, please contact our office.     Migraine Headache A migraine headache is an intense, throbbing pain on one or both sides of your head. A migraine can last for 30 minutes to several hours. CAUSES  The exact cause of a migraine headache is not always known. However, a migraine may be caused when nerves in the brain become irritated and release chemicals that cause inflammation. This causes pain. Certain things may also trigger migraines, such as:  Alcohol.  Smoking.  Stress.  Menstruation.  Aged cheeses.  Foods or drinks that contain nitrates, glutamate, aspartame, or tyramine.  Lack of sleep.  Chocolate.  Caffeine.  Hunger.  Physical exertion.  Fatigue.  Medicines used to treat chest pain (nitroglycerine), birth control pills, estrogen, and some blood pressure medicines. SIGNS AND SYMPTOMS  Pain on one or both sides of your head.  Pulsating or throbbing pain.  Severe pain that prevents daily activities.  Pain that is aggravated by any physical activity.  Nausea, vomiting, or both.  Dizziness.  Pain with exposure to bright lights, loud noises, or activity.  General sensitivity to bright lights, loud noises, or smells. Before you get a migraine, you may get warning signs that a migraine is coming (aura). An aura may include:  Seeing flashing lights.  Seeing bright spots, halos, or zig-zag lines.  Having tunnel vision or blurred vision.  Having feelings of numbness or tingling.  Having trouble talking.  Having muscle weakness. DIAGNOSIS  A migraine headache is often diagnosed based on:  Symptoms.  Physical exam.  A CT scan or MRI of  your head. These imaging tests cannot diagnose migraines, but they can help rule out other causes of headaches. TREATMENT Medicines may be given for pain and nausea. Medicines can also be given to help prevent recurrent migraines.  HOME CARE INSTRUCTIONS  Only take over-the-counter or prescription medicines for pain or discomfort as directed by your health care provider. The use of long-term narcotics is not recommended.  Lie down in a dark, quiet room when you have a migraine.  Keep a journal to find out what may trigger your migraine headaches. For example, write down:  What you eat and drink.  How much sleep you get.  Any change to your diet or medicines.  Limit alcohol consumption.  Quit smoking if you smoke.  Get 7 9 hours of sleep, or as recommended by your health care provider.  Limit stress.  Keep lights dim if bright lights bother you and make your migraines worse. SEEK IMMEDIATE MEDICAL CARE IF:   Your migraine becomes severe.  You have a fever.  You have a stiff neck.  You have vision loss.  You have muscular weakness or loss of muscle control.  You start losing your balance or have trouble walking.  You feel faint or pass out.  You have severe symptoms that are different from your first symptoms. MAKE SURE YOU:   Understand these instructions.  Will watch your condition.  Will get help right away if you are not doing well or get worse. Document Released: 11/13/2005 Document Revised: 09/03/2013 Document Reviewed: 07/21/2013 [employer] Patient Information 2014 [employer].  

## 2014-03-26 ENCOUNTER — Encounter: Payer: Self-pay | Admitting: Neurology

## 2014-03-26 ENCOUNTER — Telehealth: Payer: Self-pay | Admitting: Neurology

## 2014-03-26 NOTE — Telephone Encounter (Signed)
Left message for patient rescheduling 07/20/14 appointment to a little bit later in the day, 2:30 PM. Printed and sent letter.

## 2014-03-28 ENCOUNTER — Ambulatory Visit
Admission: RE | Admit: 2014-03-28 | Discharge: 2014-03-28 | Disposition: A | Discharge status: Home / Self Care | Payer: PRIVATE HEALTH INSURANCE | Source: Ambulatory Visit | Attending: Neurology | Admitting: Neurology

## 2014-03-28 DIAGNOSIS — G4482 Headache associated with sexual activity: Secondary | ICD-10-CM

## 2014-03-28 DIAGNOSIS — G43009 Migraine without aura, not intractable, without status migrainosus: Secondary | ICD-10-CM

## 2014-03-28 DIAGNOSIS — R51 Headache: Secondary | ICD-10-CM

## 2014-03-30 ENCOUNTER — Telehealth: Payer: Self-pay | Admitting: Neurology

## 2014-03-30 NOTE — Telephone Encounter (Signed)
I called the patient. The MRI the brain and MRA of the head was normal. The patient will be given a trial on Topamax and Maxalt.   MRI brain 03/30/2014:  Impression   Normal MRI scan of the brain without contrast   MRA head 03/30/2014:   IMPRESSION: Normal MRA of the brain without evidence of significant stenosis of large to medium-sized intracranial vessels.

## 2014-07-20 ENCOUNTER — Ambulatory Visit: Payer: PRIVATE HEALTH INSURANCE | Admitting: Adult Health

## 2015-08-25 ENCOUNTER — Ambulatory Visit (INDEPENDENT_AMBULATORY_CARE_PROVIDER_SITE_OTHER): Payer: BLUE CROSS/BLUE SHIELD | Admitting: Family Medicine

## 2015-08-25 ENCOUNTER — Encounter: Payer: Self-pay | Admitting: Family Medicine

## 2015-08-25 VITALS — BP 128/83 | HR 81 | Temp 98.0°F | Resp 16 | Ht 68.5 in | Wt 366.5 lb

## 2015-08-25 DIAGNOSIS — D688 Other specified coagulation defects: Secondary | ICD-10-CM

## 2015-08-25 DIAGNOSIS — E669 Obesity, unspecified: Secondary | ICD-10-CM | POA: Diagnosis not present

## 2015-08-25 DIAGNOSIS — D6851 Activated protein C resistance: Secondary | ICD-10-CM

## 2015-08-25 DIAGNOSIS — Z0000 Encounter for general adult medical examination without abnormal findings: Secondary | ICD-10-CM | POA: Insufficient documentation

## 2015-08-25 NOTE — Patient Instructions (Addendum)
Discussed weight loss by dietary restriction and exercise.  Recommend getting a flu shot at work.

## 2015-08-25 NOTE — Progress Notes (Signed)
Name: Luis Bowen   MRN: [id number]    DOB: [birth year]   Date:08/25/2015       Progress Note  Subjective  Chief Complaint  Chief Complaint  Patient presents with  . Employment Physical    paperwork    HPI  Here for physical for employment and general  Health maintenance.  Has been gaining weight over past year.  Trying to exercise.  Trying to avoid soft drinks. No problem-specific assessment & plan notes found for this encounter.   Past Medical History  Diagnosis Date  . Factor 5 Leiden mutation, heterozygous   . Hyperlipidemia   . Coital headache 03/20/2014  . Obese     History reviewed. No pertinent past surgical history.  Family History  Problem Relation Age of Onset  . Heart disease Maternal Grandmother   . Factor V Leiden deficiency Mother   . Diabetes Mother   . Cancer Maternal Grandfather     pancreatic  . Emphysema Paternal Grandfather   . Migraines Neg Hx     Social History   Social History  . Marital Status: Married    Spouse Name: N/A  . Number of Children: 0  . Years of Education: MA   Occupational History  . IT support    Social History Main Topics  . Smoking status: Former Smoker -- 0.10 packs/day for 1 years    Types: Cigarettes    Quit date: 07/14/2003  . 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 Alcohol Use: 1.8 oz/week    3 Cans of beer per week     Comment: 2 x/mo  . Drug Use: No  . Sexual Activity: Yes   Other Topics Concern  . Not on file   Social History Narrative     Current outpatient prescriptions:  .  aspirin 81 MG tablet, Take 81 mg by mouth daily., Disp: , Rfl:   Allergies  Allergen Reactions  . Oxycodone Hcl Other (See Comments)    Hallucinations  . Vicodin [Hydrocodone-Acetaminophen]     Non reactive     Review of Systems  Constitutional: Negative for fever, chills, weight loss and malaise/fatigue.  HENT: Negative for congestion and hearing loss.   Eyes:  Negative for blurred vision, double vision and pain.  Respiratory: Negative for cough, sputum production, shortness of breath and wheezing.   Cardiovascular: Negative for chest pain, palpitations, orthopnea and leg swelling.  Gastrointestinal: Negative for heartburn, abdominal pain and blood in stool.  Genitourinary: Negative for dysuria, urgency and frequency.  Musculoskeletal: Negative for myalgias and joint pain.  Skin: Negative for rash.  Neurological: Negative for dizziness, sensory change, focal weakness, weakness and headaches.  Psychiatric/Behavioral: Negative for depression. The patient is not nervous/anxious.       Objective  Filed Vitals:   08/25/15 1449  BP: 128/83  Pulse: 81  Temp: 98 F (36.7 C)  TempSrc: Oral  Resp: 16  Height: 5' 8.5" (1.74 m)  Weight: 366 lb 8 oz (166.243 kg)    Physical Exam  Constitutional: He is oriented to person, place, and time and well-developed, well-nourished, and in no distress. No distress.  HENT:  Head: Normocephalic and atraumatic.  Right Ear: External ear normal.  Left Ear: External ear normal.  Nose: Nose normal.  Mouth/Throat: Oropharynx is clear and moist. No oropharyngeal exudate.  Eyes: Conjunctivae and EOM are normal. Pupils are equal, round, and reactive to light. Right eye exhibits no discharge.  Left eye exhibits no discharge. No scleral icterus.  Neck: Normal range of motion. Neck supple. Carotid bruit is not present. No thyromegaly present.  Cardiovascular: Normal rate, regular rhythm, normal heart sounds and intact distal pulses.  Exam reveals no gallop and no friction rub.   No murmur heard. Pulmonary/Chest: Effort normal and breath sounds normal. No respiratory distress. He has no wheezes. He has no rales.  Abdominal: Soft. Bowel sounds are normal. He exhibits no distension. There is no tenderness. There is no rebound.  morbidly obese abd.  Genitourinary: Testes/scrotum normal and penis normal. He exhibits no  abnormal testicular mass, no testicular tenderness, no abnormal scrotal mass and no scrotal tenderness. No discharge found.  Musculoskeletal: Normal range of motion. He exhibits no edema.  Lymphadenopathy:    He has no cervical adenopathy.  Neurological: He is alert and oriented to person, place, and time. No cranial nerve deficit. Gait normal. Coordination normal.  Skin: Skin is warm and dry. No erythema.  Psychiatric: Mood, memory, affect and judgment normal.  Vitals reviewed.      No results found for this or any previous visit (from the past 2160 hour(s)).   Assessment & Plan  Problem List Items Addressed This Visit      Hematopoietic and Hemostatic   Factor 5 Leiden mutation, heterozygous   Relevant Orders   CBC with Differential     Other   Obesity   Relevant Orders   TSH   Health maintenance examination - Primary   Relevant Orders   Comprehensive Metabolic Panel (CMET)   Lipid Profile      No orders of the defined types were placed in this encounter.   1. Health maintenance examination  - Comprehensive Metabolic Panel (CMET) - Lipid Profile  2. Obesity  - TSH  3. Factor 5 Leiden mutation, heterozygous  - CBC with Differential

## 2015-08-26 LAB — CBC WITH DIFFERENTIAL/PLATELET
BASOS: 1 %
Basophils Absolute: 0.1 10*3/uL (ref 0.0–0.2)
EOS (ABSOLUTE): 0.1 10*3/uL (ref 0.0–0.4)
Eos: 1 %
HEMATOCRIT: 44.5 % (ref 37.5–51.0)
HEMOGLOBIN: 15.4 g/dL (ref 12.6–17.7)
IMMATURE GRANULOCYTES: 0 %
Immature Grans (Abs): 0 10*3/uL (ref 0.0–0.1)
LYMPHS ABS: 3.5 10*3/uL — AB (ref 0.7–3.1)
Lymphs: 41 %
MCH: 30 pg (ref 26.6–33.0)
MCHC: 34.6 g/dL (ref 31.5–35.7)
MCV: 87 fL (ref 79–97)
MONOCYTES: 9 %
MONOS ABS: 0.7 10*3/uL (ref 0.1–0.9)
Neutrophils Absolute: 4.1 10*3/uL (ref 1.4–7.0)
Neutrophils: 48 %
Platelets: 245 10*3/uL (ref 150–379)
RBC: 5.14 x10E6/uL (ref 4.14–5.80)
RDW: 13.8 % (ref 12.3–15.4)
WBC: 8.6 10*3/uL (ref 3.4–10.8)

## 2015-08-27 ENCOUNTER — Other Ambulatory Visit: Payer: Self-pay | Admitting: Family Medicine

## 2015-08-27 LAB — COMPREHENSIVE METABOLIC PANEL
A/G RATIO: 1.6 (ref 1.1–2.5)
ALBUMIN: 4.4 g/dL (ref 3.5–5.5)
ALT: 40 IU/L (ref 0–44)
AST: 27 IU/L (ref 0–40)
Alkaline Phosphatase: 78 IU/L (ref 39–117)
BUN / CREAT RATIO: 15 (ref 8–19)
BUN: 9 mg/dL (ref 6–20)
CHLORIDE: 100 mmol/L (ref 97–108)
CO2: 19 mmol/L (ref 18–29)
Calcium: 9.2 mg/dL (ref 8.7–10.2)
Creatinine, Ser: 0.59 mg/dL — ABNORMAL LOW (ref 0.76–1.27)
GFR calc non Af Amer: 133 mL/min/{1.73_m2} (ref >59)
GFR, EST AFRICAN AMERICAN: 154 mL/min/{1.73_m2} (ref >59)
GLOBULIN, TOTAL: 2.8 g/dL (ref 1.5–4.5)
GLUCOSE: 137 mg/dL — AB (ref 65–99)
Potassium: 4.6 mmol/L (ref 3.5–5.2)
SODIUM: 140 mmol/L (ref 134–144)
TOTAL PROTEIN: 7.2 g/dL (ref 6.0–8.5)

## 2015-08-27 LAB — LIPID PANEL
Chol/HDL Ratio: 8.4 ratio units — ABNORMAL HIGH (ref 0.0–5.0)
Cholesterol, Total: 194 mg/dL (ref 100–199)
HDL: 23 mg/dL — ABNORMAL LOW (ref >39)
Triglycerides: 777 mg/dL (ref 0–149)

## 2015-08-27 LAB — TSH: TSH: 1.35 u[IU]/mL (ref 0.450–4.500)

## 2015-08-27 MED ORDER — FENOFIBRATE 145 MG PO TABS: 145.0000 mg | ORAL_TABLET | Freq: Every day | ORAL | Status: DC

## 2015-08-31 LAB — HGB A1C W/O EAG: HEMOGLOBIN A1C: 5.7 % — AB (ref 4.8–5.6)

## 2015-09-18 LAB — SPECIMEN STATUS REPORT

## 2015-12-27 ENCOUNTER — Ambulatory Visit: Payer: PRIVATE HEALTH INSURANCE | Admitting: Family Medicine

## 2016-01-18 ENCOUNTER — Ambulatory Visit: Payer: PRIVATE HEALTH INSURANCE | Admitting: Family Medicine

## 2016-03-07 ENCOUNTER — Ambulatory Visit: Payer: PRIVATE HEALTH INSURANCE | Admitting: Family Medicine

## 2016-04-11 ENCOUNTER — Emergency Department: Payer: BLUE CROSS/BLUE SHIELD

## 2016-04-11 ENCOUNTER — Encounter: Payer: Self-pay | Admitting: Emergency Medicine

## 2016-04-11 ENCOUNTER — Emergency Department
Admission: EM | Admit: 2016-04-11 | Discharge: 2016-04-11 | Disposition: A | Discharge status: Home / Self Care | Payer: BLUE CROSS/BLUE SHIELD | Attending: Emergency Medicine | Admitting: Emergency Medicine

## 2016-04-11 DIAGNOSIS — J019 Acute sinusitis, unspecified: Secondary | ICD-10-CM | POA: Diagnosis not present

## 2016-04-11 DIAGNOSIS — E785 Hyperlipidemia, unspecified: Secondary | ICD-10-CM | POA: Diagnosis not present

## 2016-04-11 DIAGNOSIS — R0600 Dyspnea, unspecified: Secondary | ICD-10-CM

## 2016-04-11 DIAGNOSIS — Z7982 Long term (current) use of aspirin: Secondary | ICD-10-CM | POA: Diagnosis not present

## 2016-04-11 DIAGNOSIS — R0602 Shortness of breath: Secondary | ICD-10-CM | POA: Diagnosis present

## 2016-04-11 DIAGNOSIS — Z79899 Other long term (current) drug therapy: Secondary | ICD-10-CM | POA: Diagnosis not present

## 2016-04-11 DIAGNOSIS — J40 Bronchitis, not specified as acute or chronic: Secondary | ICD-10-CM | POA: Insufficient documentation

## 2016-04-11 DIAGNOSIS — Z87891 Personal history of nicotine dependence: Secondary | ICD-10-CM | POA: Diagnosis not present

## 2016-04-11 LAB — CBC
HEMATOCRIT: 40.7 % (ref 40.0–52.0)
HEMOGLOBIN: 13.9 g/dL (ref 13.0–18.0)
MCH: 29.4 pg (ref 26.0–34.0)
MCHC: 34.1 g/dL (ref 32.0–36.0)
MCV: 86.1 fL (ref 80.0–100.0)
Platelets: 222 10*3/uL (ref 150–440)
RBC: 4.72 MIL/uL (ref 4.40–5.90)
RDW: 13.5 % (ref 11.5–14.5)
WBC: 12.5 10*3/uL — ABNORMAL HIGH (ref 3.8–10.6)

## 2016-04-11 LAB — TROPONIN I: Troponin I: <0.03 ng/mL (ref <0.031)

## 2016-04-11 LAB — BASIC METABOLIC PANEL
ANION GAP: 8 (ref 5–15)
BUN: 12 mg/dL (ref 6–20)
CO2: 26 mmol/L (ref 22–32)
Calcium: 9.3 mg/dL (ref 8.9–10.3)
Chloride: 103 mmol/L (ref 101–111)
Creatinine, Ser: 0.75 mg/dL (ref 0.61–1.24)
GFR calc non Af Amer: >60 mL/min (ref >60)
Glucose, Bld: 117 mg/dL — ABNORMAL HIGH (ref 65–99)
POTASSIUM: 4.3 mmol/L (ref 3.5–5.1)
Sodium: 137 mmol/L (ref 135–145)

## 2016-04-11 LAB — POCT RAPID STREP A: Streptococcus, Group A Screen (Direct): NEGATIVE

## 2016-04-11 LAB — BRAIN NATRIURETIC PEPTIDE: B Natriuretic Peptide: 23 pg/mL (ref 0.0–100.0)

## 2016-04-11 MED ORDER — ALBUTEROL SULFATE HFA 108 (90 BASE) MCG/ACT IN AERS: 2.0000 | INHALATION_SPRAY | Freq: Four times a day (QID) | RESPIRATORY_TRACT | Status: DC | PRN

## 2016-04-11 MED ORDER — DEXAMETHASONE SODIUM PHOSPHATE 10 MG/ML IJ SOLN
INTRAMUSCULAR | Status: AC
  Administered 2016-04-11: 10 mg via ORAL
  Filled 2016-04-11: qty 1

## 2016-04-11 MED ORDER — IPRATROPIUM-ALBUTEROL 0.5-2.5 (3) MG/3ML IN SOLN
3.0000 mL | Freq: Once | RESPIRATORY_TRACT | Status: AC
  Administered 2016-04-11: 3 mL via RESPIRATORY_TRACT
  Filled 2016-04-11: qty 3

## 2016-04-11 MED ORDER — IBUPROFEN 600 MG PO TABS
600.0000 mg | ORAL_TABLET | Freq: Once | ORAL | Status: AC
  Administered 2016-04-11: 600 mg via ORAL
  Filled 2016-04-11: qty 1

## 2016-04-11 MED ORDER — AMOXICILLIN-POT CLAVULANATE 875-125 MG PO TABS: 1.0000 | ORAL_TABLET | Freq: Two times a day (BID) | ORAL | Status: AC

## 2016-04-11 MED ORDER — DEXAMETHASONE 10 MG/ML FOR PEDIATRIC ORAL USE
10.0000 mg | Freq: Once | INTRAMUSCULAR | Status: AC
  Administered 2016-04-11: 10 mg via ORAL

## 2016-04-11 NOTE — ED Notes (Signed)
Patient ambulatory to triage with steady gait, without difficulty or distress noted; pt reports since last Monday having cold symptoms; increasing SHOB expecially when lying supine; productive cough of yellow/green sputum, sinus congestion; denies fever; c/o pain to jaw/forehead/neck

## 2016-04-11 NOTE — ED Provider Notes (Signed)
[facility] Emergency Department Provider Note   ____________________________________________  Time seen: Approximately 457 AM  I have reviewed the triage vital signs and the nursing notes.   HISTORY  Chief Complaint Shortness of Breath    HPI Luis Bowen is a 35 y.o. male who comes into the hospital today with shortness of breath. The patient reports that he has been sick for the past 8 days with cold symptoms. He thinks it is now progressed into a sinus infection. He reports that he is felt short of breath whenever he is trying to fall asleep. He has a cough with some sore throat runny nose and sinus pressure. The patient has been taking Mucinex, vitamin C, Tylenol and using Netti Pots with nasal saline solution to alleviate his symptoms. He has been trying to rest and get sleepand he took 3 days off last week. He denies chest pain but feels as though his chest is heavy. He feels like he is breathing shallow and can't get a good deep breath. The patient does not have any pain when he takes a deep breath. The patient has never felt this way before reports that this is the worst cold he has ever had. He's had some yellow-green production. He's had no fevers. The patient is denying pain at this time.   Past Medical History  Diagnosis Date  . Factor 5 Leiden mutation, heterozygous (HCC)   . Hyperlipidemia   . Coital headache 03/20/2014  . Obese     Patient Active Problem List   Diagnosis Date Noted  . Health maintenance examination 08/25/2015  . Coital headache 03/20/2014  . Pure hyperglyceridemia 08/14/2011  . Factor 5 Leiden mutation, heterozygous (HCC) 07/14/2011  . Obesity 07/14/2011  . Snoring 07/14/2011    History reviewed. No pertinent past surgical history.  Current Outpatient Rx  Name  Route  Sig  Dispense  Refill  . albuterol (PROVENTIL HFA;VENTOLIN HFA) 108 (90 Base) MCG/ACT inhaler   Inhalation   Inhale 2 puffs into the lungs every 6  (six) hours as needed for wheezing or shortness of breath.   1 Inhaler   2   . amoxicillin-clavulanate (AUGMENTIN) 875-125 MG tablet   Oral   Take 1 tablet by mouth every 12 (twelve) hours.   20 tablet   0   . aspirin 81 MG tablet   Oral   Take 81 mg by mouth daily.         . fenofibrate (TRICOR) 145 MG tablet   Oral   Take 1 tablet (145 mg total) by mouth daily.   30 tablet   6     Allergies Oxycodone hcl and Vicodin  Family History  Problem Relation Age of Onset  . Heart disease Maternal Grandmother   . Factor V Leiden deficiency Mother   . Diabetes Mother   . Cancer Maternal Grandfather     pancreatic  . Emphysema Paternal Grandfather   . Migraines Neg Hx     Social History Social History  Substance Use Topics  . Smoking status: Former Smoker -- 0.10 packs/day for 1 years    Types: Cigarettes    Quit date: 07/14/2003  . 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 Alcohol Use: 1.8 oz/week    3 Cans of beer per week     Comment: 2 x/mo    Review of Systems Constitutional: No fever/chills Eyes: No visual changes. ENT:  sore throat. Cardiovascular: Chest heaviness Respiratory: Cough and shortness of breath. Gastrointestinal: No abdominal pain.  No nausea, no vomiting.  No diarrhea.  No constipation. Genitourinary: Negative for dysuria. Musculoskeletal: Negative for back pain. Skin: Negative for rash. Neurological: Headache  10-point ROS otherwise negative.  ____________________________________________   PHYSICAL EXAM:  VITAL SIGNS: ED Triage Vitals  Enc Vitals Group     BP 04/11/16 0250 154/76 mmHg     Pulse Rate 04/11/16 0250 78     Resp 04/11/16 0250 20     Temp 04/11/16 0250 98 F (36.7 C)     Temp Source 04/11/16 0250 Oral     SpO2 04/11/16 0250 97 %     Weight 04/11/16 0250 360 lb (163.295 kg)     Height 04/11/16 0250  (1.727 m)     Head Cir --      Peak Flow --      Pain Score  04/11/16 0248 5     Pain Loc --      Pain Edu? --      Excl. in GC? --     Constitutional: Alert and oriented. Ill appearing and in Mild distress. Eyes: Conjunctivae are normal. PERRL. EOMI. Head: Atraumatic. Maxillary and ethmoid sinus tenderness to palpation Nose: Nasal congestion Mouth/Throat: Purulent postnasal drainage, mucous membranes moist, oropharynx appears swollen. Neck: No stridor.   Cardiovascular: Normal rate, regular rhythm. Grossly normal heart sounds.  Good peripheral circulation. Respiratory: Normal respiratory effort.  No retractions. Lungs CTAB. Gastrointestinal: Soft and nontender. No distention. Positive bowel sounds Musculoskeletal: No lower extremity tenderness nor edema.   Neurologic:  Normal speech and language.  Skin:  Skin is warm, dry and intact. No rash noted. Psychiatric: Mood and affect are normal. Speech and behavior are normal.  ____________________________________________   LABS (all labs ordered are listed, but only abnormal results are displayed)  Labs Reviewed  BASIC METABOLIC PANEL - Abnormal; Notable for the following:    Glucose, Bld 117 (*)    All other components within normal limits  CBC - Abnormal; Notable for the following:    WBC 12.5 (*)    All other components within normal limits  CULTURE, GROUP A STREP ([facility])  TROPONIN I  BRAIN NATRIURETIC PEPTIDE  POCT RAPID STREP A   ____________________________________________  EKG  ED ECG REPORT I, Rebecka Apley, the attending physician, personally viewed and interpreted this ECG.   Date: 04/11/2016  EKG Time: 250  Rate: 84  Rhythm: normal sinus rhythm  Axis: normal  Intervals:left anterior fascicular block  ST&T Change: normal  ____________________________________________  RADIOLOGY  CXR: No active cardiopulmonary disease ____________________________________________   PROCEDURES  Procedure(s) performed: None  Critical Care performed:  No  ____________________________________________   INITIAL IMPRESSION / ASSESSMENT AND PLAN / ED COURSE  Pertinent labs & imaging results that were available during my care of the patient were reviewed by me and considered in my medical decision making (see chart for details).  This is a 35 year old male who comes into the hospital with some shortness of breath. The patient has had cold symptoms for approximately 8 days. I will give the patient a breathing treatment. Although he does not have any wheezing I will determine if it will help her symptoms. The patient will also receive a dose of Decadron for the throat swelling. He does not have a pneumonia but does have a white blood cell count of 12.5. It is possible the patient may have some pharyngitis versus rhonchi test.  Once I received the results of his blood work and his chest x-ray he will be dispositioned.  The patient's breathing is significantly improved after the DuoNeb treatment. The patient reports that he no longer has heaviness in his chest. The patient's blood work is unremarkable. He'll be discharged home to follow-up with his primary care physician. ____________________________________________   FINAL CLINICAL IMPRESSION(S) / ED DIAGNOSES  Final diagnoses:  Bronchitis  Acute sinusitis, recurrence not specified, unspecified location  Dyspnea      NEW MEDICATIONS STARTED DURING THIS VISIT:  New Prescriptions   ALBUTEROL (PROVENTIL HFA;VENTOLIN HFA) 108 (90 BASE) MCG/ACT INHALER    Inhale 2 puffs into the lungs every 6 (six) hours as needed for wheezing or shortness of breath.   AMOXICILLIN-CLAVULANATE (AUGMENTIN) 875-125 MG TABLET    Take 1 tablet by mouth every 12 (twelve) hours.     Note:  This document was prepared using Dragon voice recognition software and may include unintentional dictation errors.    Rebecka ApleyAllison P Deissy Guilbert, MD 04/11/16 [phone]

## 2016-04-11 NOTE — Discharge Instructions (Signed)
Shortness of Breath °Shortness of breath means you have trouble breathing. It could also mean that you have a medical problem. You should get immediate medical care for shortness of breath. °CAUSES  °· Not enough oxygen in the air such as with high altitudes or a smoke-filled room. °· Certain lung diseases, infections, or problems. °· Heart disease or conditions, such as angina or heart failure. °· Low red blood cells (anemia). °· Poor physical fitness, which can cause shortness of breath when you exercise. °· Chest or back injuries or stiffness. °· Being overweight. °· Smoking. °· Anxiety, which can make you feel like you are not getting enough air. °DIAGNOSIS  °Serious medical problems can often be found during your physical exam. Tests may also be done to determine why you are having shortness of breath. Tests may include: °· Chest X-rays. °· Lung function tests. °· Blood tests. °· An electrocardiogram (ECG). °· An ambulatory electrocardiogram. An ambulatory ECG records your heartbeat patterns over a 24-hour period. °· Exercise testing. °· A transthoracic echocardiogram (TTE). During echocardiography, sound waves are used to evaluate how blood flows through your heart. °· A transesophageal echocardiogram (TEE). °· Imaging scans. °Your health care provider may not be able to find a cause for your shortness of breath after your exam. In this case, it is important to have a follow-up exam with your health care provider as directed.  °TREATMENT  °Treatment for shortness of breath depends on the cause of your symptoms and can vary greatly. °HOME CARE INSTRUCTIONS  °· Do not smoke. Smoking is a common cause of shortness of breath. If you smoke, ask for help to quit. °· Avoid being around chemicals or things that may bother your breathing, such as paint fumes and dust. °· Rest as needed. Slowly resume your usual activities. °· If medicines were prescribed, take them as directed for the full length of time directed. This  includes oxygen and any inhaled medicines. °· Keep all follow-up appointments as directed by your health care provider. °SEEK MEDICAL CARE IF:  °· Your condition does not improve in the time expected. °· You have a hard time doing your normal activities even with rest. °· You have any new symptoms. °SEEK IMMEDIATE MEDICAL CARE IF:  °· Your shortness of breath gets worse. °· You feel light-headed, faint, or develop a cough not controlled with medicines. °· You start coughing up blood. °· You have pain with breathing. °· You have chest pain or pain in your arms, shoulders, or abdomen. °· You have a fever. °· You are unable to walk up stairs or exercise the way you normally do. °MAKE SURE YOU: °· Understand these instructions. °· Will watch your condition. °· Will get help right away if you are not doing well or get worse. °  °This information is not intended to replace advice given to you by your health care provider. Make sure you discuss any questions you have with your health care provider. °  °Document Released: 08/08/2001 Document Revised: 11/18/2013 Document Reviewed: 01/29/2012 °Elsevier Interactive Patient Education ©2016 Elsevier Inc. ° °Upper Respiratory Infection, Adult °Most upper respiratory infections (URIs) are a viral infection of the air passages leading to the lungs. A URI affects the nose, throat, and upper air passages. The most common type of URI is nasopharyngitis and is typically referred to as "the common cold." °URIs run their course and usually go away on their own. Most of the time, a URI does not require medical attention, but sometimes   a bacterial infection in the upper airways can follow a viral infection. This is called a secondary infection. Sinus and middle ear infections are common types of secondary upper respiratory infections. Bacterial pneumonia can also complicate a URI. A URI can worsen asthma and chronic obstructive pulmonary disease (COPD). Sometimes, these complications can  require emergency medical care and may be life threatening.  CAUSES Almost all URIs are caused by viruses. A virus is a type of germ and can spread from one person to another.  RISKS FACTORS You may be at risk for a URI if:   You smoke.   You have chronic heart or lung disease.  You have a weakened defense (immune) system.   You are very young or very old.   You have nasal allergies or asthma.  You work in crowded or poorly ventilated areas.  You work in health care facilities or schools. SIGNS AND SYMPTOMS  Symptoms typically develop 2-3 days after you come in contact with a cold virus. Most viral URIs last 7-10 days. However, viral URIs from the influenza virus (flu virus) can last 14-18 days and are typically more severe. Symptoms may include:   Runny or stuffy (congested) nose.   Sneezing.   Cough.   Sore throat.   Headache.   Fatigue.   Fever.   Loss of appetite.   Pain in your forehead, behind your eyes, and over your cheekbones (sinus pain).  Muscle aches.  DIAGNOSIS  Your health care provider may diagnose a URI by:  Physical exam.  Tests to check that your symptoms are not due to another condition such as:  Strep throat.  Sinusitis.  Pneumonia.  Asthma. TREATMENT  A URI goes away on its own with time. It cannot be cured with medicines, but medicines may be prescribed or recommended to relieve symptoms. Medicines may help:  Reduce your fever.  Reduce your cough.  Relieve nasal congestion. HOME CARE INSTRUCTIONS   Take medicines only as directed by your health care provider.   Gargle warm saltwater or take cough drops to comfort your throat as directed by your health care provider.  Use a warm mist humidifier or inhale steam from a shower to increase air moisture. This may make it easier to breathe.  Drink enough fluid to keep your urine clear or pale yellow.   Eat soups and other clear broths and maintain good nutrition.    Rest as needed.   Return to work when your temperature has returned to normal or as your health care provider advises. You may need to stay home longer to avoid infecting others. You can also use a face mask and careful hand washing to prevent spread of the virus.  Increase the usage of your inhaler if you have asthma.   Do not use any tobacco products, including cigarettes, chewing tobacco, or electronic cigarettes. If you need help quitting, ask your health care provider. PREVENTION  The best way to protect yourself from getting a cold is to practice good hygiene.   Avoid oral or hand contact with people with cold symptoms.   Wash your hands often if contact occurs.  There is no clear evidence that vitamin C, vitamin E, echinacea, or exercise reduces the chance of developing a cold. However, it is always recommended to get plenty of rest, exercise, and practice good nutrition.  SEEK MEDICAL CARE IF:   You are getting worse rather than better.   Your symptoms are not controlled by medicine.  You have chills.  You have worsening shortness of breath.  You have brown or red mucus.  You have yellow or brown nasal discharge.  You have pain in your face, especially when you bend forward.  You have a fever.  You have swollen neck glands.  You have pain while swallowing.  You have white areas in the back of your throat. SEEK IMMEDIATE MEDICAL CARE IF:   You have severe or persistent:  Headache.  Ear pain.  Sinus pain.  Chest pain.  You have chronic lung disease and any of the following:  Wheezing.  Prolonged cough.  Coughing up blood.  A change in your usual mucus.  You have a stiff neck.  You have changes in your:  Vision.  Hearing.  Thinking.  Mood. MAKE SURE YOU:   Understand these instructions.  Will watch your condition.  Will get help right away if you are not doing well or get worse.   This information is not intended to  replace advice given to you by your health care provider. Make sure you discuss any questions you have with your health care provider.   Document Released: 05/09/2001 Document Revised: 03/30/2015 Document Reviewed: 02/18/2014 Elsevier Interactive Patient Education 2016 Elsevier Inc.  Sinusitis, Adult Sinusitis is redness, soreness, and inflammation of the paranasal sinuses. Paranasal sinuses are air pockets within the bones of your face. They are located beneath your eyes, in the middle of your forehead, and above your eyes. In healthy paranasal sinuses, mucus is able to drain out, and air is able to circulate through them by way of your nose. However, when your paranasal sinuses are inflamed, mucus and air can become trapped. This can allow bacteria and other germs to grow and cause infection. Sinusitis can develop quickly and last only a short time (acute) or continue over a long period (chronic). Sinusitis that lasts for more than 12 weeks is considered chronic. CAUSES Causes of sinusitis include:  Allergies.  Structural abnormalities, such as displacement of the cartilage that separates your nostrils (deviated septum), which can decrease the air flow through your nose and sinuses and affect sinus drainage.  Functional abnormalities, such as when the small hairs (cilia) that line your sinuses and help remove mucus do not work properly or are not present. SIGNS AND SYMPTOMS Symptoms of acute and chronic sinusitis are the same. The primary symptoms are pain and pressure around the affected sinuses. Other symptoms include:  Upper toothache.  Earache.  Headache.  Bad breath.  Decreased sense of smell and taste.  A cough, which worsens when you are lying flat.  Fatigue.  Fever.  Thick drainage from your nose, which often is green and may contain pus (purulent).  Swelling and warmth over the affected sinuses. DIAGNOSIS Your health care provider will perform a physical exam.  During your exam, your health care provider may perform any of the following to help determine if you have acute sinusitis or chronic sinusitis:  Look in your nose for signs of abnormal growths in your nostrils (nasal polyps).  Tap over the affected sinus to check for signs of infection.  View the inside of your sinuses using an imaging device that has a light attached (endoscope). If your health care provider suspects that you have chronic sinusitis, one or more of the following tests may be recommended:  Allergy tests.  Nasal culture. A sample of mucus is taken from your nose, sent to a lab, and screened for bacteria.  Nasal cytology. A  sample of mucus is taken from your nose and examined by your health care provider to determine if your sinusitis is related to an allergy. TREATMENT Most cases of acute sinusitis are related to a viral infection and will resolve on their own within 10 days. Sometimes, medicines are prescribed to help relieve symptoms of both acute and chronic sinusitis. These may include pain medicines, decongestants, nasal steroid sprays, or saline sprays. However, for sinusitis related to a bacterial infection, your health care provider will prescribe antibiotic medicines. These are medicines that will help kill the bacteria causing the infection. Rarely, sinusitis is caused by a fungal infection. In these cases, your health care provider will prescribe antifungal medicine. For some cases of chronic sinusitis, surgery is needed. Generally, these are cases in which sinusitis recurs more than 3 times per year, despite other treatments. HOME CARE INSTRUCTIONS  Drink plenty of water. Water helps thin the mucus so your sinuses can drain more easily.  Use a humidifier.  Inhale steam 3-4 times a day (for example, sit in the bathroom with the shower running).  Apply a warm, moist washcloth to your face 3-4 times a day, or as directed by your health care provider.  Use saline  nasal sprays to help moisten and clean your sinuses.  Take medicines only as directed by your health care provider.  If you were prescribed either an antibiotic or antifungal medicine, finish it all even if you start to feel better. SEEK IMMEDIATE MEDICAL CARE IF:  You have increasing pain or severe headaches.  You have nausea, vomiting, or drowsiness.  You have swelling around your face.  You have vision problems.  You have a stiff neck.  You have difficulty breathing.   This information is not intended to replace advice given to you by your health care provider. Make sure you discuss any questions you have with your health care provider.   Document Released: 11/13/2005 Document Revised: 12/04/2014 Document Reviewed: 11/28/2011 Elsevier Interactive Patient Education [employer]2016 [employer] Inc.

## 2016-04-14 LAB — CULTURE, GROUP A STREP (THRC)

## 2016-10-20 ENCOUNTER — Encounter: Payer: Self-pay | Admitting: Emergency Medicine

## 2016-10-20 ENCOUNTER — Emergency Department
Admission: EM | Admit: 2016-10-20 | Discharge: 2016-10-20 | Disposition: A | Discharge status: Home / Self Care | Payer: BLUE CROSS/BLUE SHIELD | Attending: Emergency Medicine | Admitting: Emergency Medicine

## 2016-10-20 ENCOUNTER — Emergency Department: Payer: BLUE CROSS/BLUE SHIELD

## 2016-10-20 DIAGNOSIS — R252 Cramp and spasm: Secondary | ICD-10-CM

## 2016-10-20 DIAGNOSIS — Z7982 Long term (current) use of aspirin: Secondary | ICD-10-CM | POA: Diagnosis not present

## 2016-10-20 DIAGNOSIS — Z87891 Personal history of nicotine dependence: Secondary | ICD-10-CM | POA: Diagnosis not present

## 2016-10-20 DIAGNOSIS — Z79899 Other long term (current) drug therapy: Secondary | ICD-10-CM | POA: Diagnosis not present

## 2016-10-20 DIAGNOSIS — M79604 Pain in right leg: Secondary | ICD-10-CM | POA: Diagnosis present

## 2016-10-20 LAB — PROTIME-INR
INR: 0.99
Prothrombin Time: 13.1 seconds (ref 11.4–15.2)

## 2016-10-20 NOTE — ED Triage Notes (Signed)
Pt with factor 5 clotting issue has been having bilateral leg cramps that have been increasing in frequency and duration.

## 2016-10-20 NOTE — ED Provider Notes (Signed)
[facility] Emergency Department Provider Note ____________________________________________   First MD Initiated Contact with Patient 10/20/16 1250  (approximate)  I have reviewed the triage vital signs and the nursing notes.  HISTORY  Chief Complaint Leg Pain  HPI Lilian Kapurrevor Mccurdy is a 35 y.o. male here for evaluation of crampy discomfort in both eyes.  Patient reports last night after eating and skating injury noticed like he felt cramping discomfort in both eyes. Denies any numbness tingling or weakness. He has not noticed any back pain, no cold or blue feet. No nausea or vomiting. No swelling of the legs. Does report a chronic injury of the left lower ankle that was treated nonsurgically years ago after skiing accident.  Reports just some mild achiness in both calves. No trauma has a history of factor V Leiden, and was told by his mother to make sure he does not have any blood clots in the leg   Past Medical History:  Diagnosis Date  . Coital headache 03/20/2014  . Factor 5 Leiden mutation, heterozygous (HCC)   . Hyperlipidemia   . Obese     Patient Active Problem List   Diagnosis Date Noted  . Health maintenance examination 08/25/2015  . Coital headache 03/20/2014  . Pure hyperglyceridemia 08/14/2011  . Factor 5 Leiden mutation, heterozygous (HCC) 07/14/2011  . Obesity 07/14/2011  . Snoring 07/14/2011    History reviewed. No pertinent surgical history.  Prior to Admission medications   Medication Sig Start Date End Date Taking? Authorizing Provider  albuterol (PROVENTIL HFA;VENTOLIN HFA) 108 (90 Base) MCG/ACT inhaler Inhale 2 puffs into the lungs every 6 (six) hours as needed for wheezing or shortness of breath. 04/11/16   Rebecka ApleyAllison P Webster, MD  aspirin 81 MG tablet Take 81 mg by mouth daily.    Historical Provider, MD  fenofibrate (TRICOR) 145 MG tablet Take 1 tablet (145 mg total) by mouth daily. 08/27/15   Janeann ForehandJames H Hawkins Jr., MD     Allergies Oxycodone hcl and Vicodin [hydrocodone-acetaminophen]  Family History  Problem Relation Age of Onset  . Heart disease Maternal Grandmother   . Factor V Leiden deficiency Mother   . Diabetes Mother   . Cancer Maternal Grandfather     pancreatic  . Emphysema Paternal Grandfather   . Migraines Neg Hx     Social History Social History  Substance Use Topics  . Smoking status: Former Smoker    Packs/day: 0.10    Years: 1.00    Types: Cigarettes    Quit date: 07/14/2003  . 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 Alcohol use 1.8 oz/week    3 Cans of beer per week     Comment: 2 x/mo    Review of Systems Constitutional: No fever/chills Eyes: No visual changes. ENT: No sore throat. Cardiovascular: Denies chest pain. Respiratory: Denies shortness of breath. Gastrointestinal: No abdominal pain.  No nausea, no vomiting.  Musculoskeletal: Negative for back pain. Skin: Negative for rash. Neurological: Negative for headaches, focal weakness or numbness.  10-point ROS otherwise negative.  ____________________________________________   PHYSICAL EXAM:  VITAL SIGNS: ED Triage Vitals  Enc Vitals Group     BP 10/20/16 1157 129/75     Pulse Rate 10/20/16 1157 81     Resp 10/20/16 1422 16     Temp 10/20/16 1157 98 F (36.7 C)     Temp Source 10/20/16 1157 Oral     SpO2  10/20/16 1157 97 %     Weight 10/20/16 1158 (!) 360 lb (163.3 kg)     Height 10/20/16 1158 5\' 9"  (1.753 m)     Head Circumference --      Peak Flow --      Pain Score 10/20/16 1158 2     Pain Loc --      Pain Edu? --      Excl. in GC? --     Constitutional: Alert and oriented. Well appearing and in no acute distress. Eyes: Conjunctivae are normal. PERRL. EOMI. Head: Atraumatic. Nose: No congestion/rhinnorhea. Mouth/Throat: Mucous membranes are moist.  Cardiovascular: Normal rate, regular rhythm. Respiratory: Normal respiratory effort.  No  retractions. Lungs CTAB. Gastrointestinal: Soft and nontender. Obese. No distention.  Musculoskeletal:  Lower Extremities  No edema. Normal DP/PT pulses bilateral with good cap refill.  Normal neuro-motor function lower extremities bilateral.  RIGHT Right lower extremity demonstrates normal strength, good use of all muscles. No edema bruising or contusions of the right hip, right knee, right ankle. Full range of motion of the right lower extremity without pain. No pain on axial loading. No evidence of trauma.  LEFT Left lower extremity demonstrates normal strength, good use of all muscles. No edema bruising or contusions of the hip,  knee, ankle. Full range of motion of the left lower extremity without pain. No pain on axial loading. No evidence of trauma.   Neurologic:  Normal speech and language. No gross focal neurologic deficits are appreciated. No gait instability. Skin:  Skin is warm, dry and intact. No rash noted. Psychiatric: Mood and affect are normal. Speech and behavior are normal.  ____________________________________________   LABS (all labs ordered are listed, but only abnormal results are displayed)  Labs Reviewed  COMPREHENSIVE METABOLIC PANEL - Abnormal; Notable for the following:       Result Value   Glucose, Bld 110 (*)    Total Protein 8.4 (*)    Total Bilirubin <0.1 (*)    All other components within normal limits  PROTIME-INR  CBC WITH DIFFERENTIAL/PLATELET   ____________________________________________  EKG   ____________________________________________  RADIOLOGY  [place] Venous Img Lower Bilateral  Result Date: 10/20/2016 CLINICAL DATA:  35 year old male with a history of factor 5 Leiden and bilateral calf discomfort. Evaluate for DVT. EXAM: BILATERAL LOWER EXTREMITY VENOUS DOPPLER ULTRASOUND TECHNIQUE: Gray-scale sonography with graded compression, as well as color Doppler and duplex ultrasound were performed to evaluate the lower extremity deep  venous systems from the level of the common femoral vein and including the common femoral, femoral, profunda femoral, popliteal and calf veins including the posterior tibial, peroneal and gastrocnemius veins when visible. The superficial great saphenous vein was also interrogated. Spectral Doppler was utilized to evaluate flow at rest and with distal augmentation maneuvers in the common femoral, femoral and popliteal veins. COMPARISON:  None. FINDINGS: RIGHT LOWER EXTREMITY Common Femoral Vein: No evidence of thrombus. Normal compressibility, respiratory phasicity and response to augmentation. Saphenofemoral Junction: No evidence of thrombus. Normal compressibility and flow on color Doppler imaging. Profunda Femoral Vein: No evidence of thrombus. Normal compressibility and flow on color Doppler imaging. Femoral Vein: No evidence of thrombus. Normal compressibility, respiratory phasicity and response to augmentation. Popliteal Vein: No evidence of thrombus. Normal compressibility, respiratory phasicity and response to augmentation. Calf Veins: No evidence of thrombus. Normal compressibility and flow on color Doppler imaging. Superficial Great Saphenous Vein: No evidence of thrombus. Normal compressibility and flow on color Doppler imaging. Venous Reflux:  None. Other  Findings:  None. LEFT LOWER EXTREMITY Common Femoral Vein: No evidence of thrombus. Normal compressibility, respiratory phasicity and response to augmentation. Saphenofemoral Junction: No evidence of thrombus. Normal compressibility and flow on color Doppler imaging. Profunda Femoral Vein: No evidence of thrombus. Normal compressibility and flow on color Doppler imaging. Femoral Vein: No evidence of thrombus. Normal compressibility, respiratory phasicity and response to augmentation. Popliteal Vein: No evidence of thrombus. Normal compressibility, respiratory phasicity and response to augmentation. Calf Veins: No evidence of thrombus. Normal  compressibility and flow on color Doppler imaging. Superficial Great Saphenous Vein: No evidence of thrombus. Normal compressibility and flow on color Doppler imaging. Venous Reflux:  None. Other Findings:  None. IMPRESSION: No evidence of deep venous thrombosis. Electronically Signed   By: Malachy Moan M.D.   On: 10/20/2016 14:54    ____________________________________________   PROCEDURES  Procedure(s) performed: None  Procedures  Critical Care performed: No  ____________________________________________   INITIAL IMPRESSION / ASSESSMENT AND PLAN / ED COURSE  Pertinent labs & imaging results that were available during my care of the patient were reviewed by me and considered in my medical decision making (see chart for details).  Patient presents for bilateral thigh discomfort. Neurovascularly intact distal. Does have a history of factor V, thus we'll evaluate for any evidence of DVT though clinical exam does not suggest it. In addition check basic electrolytes as possible etiology.  ----------------------------------------- 3:16 PM on 10/20/2016 -----------------------------------------  Ultrasound normal neg for dvts. Return precautions and treatment recommendations and follow-up discussed with the patient who is agreeable with the plan.   Clinical Course      ____________________________________________   FINAL CLINICAL IMPRESSION(S) / ED DIAGNOSES  Final diagnoses:  Leg cramping      NEW MEDICATIONS STARTED DURING THIS VISIT:  New Prescriptions   No medications on file     Note:  This document was prepared using Dragon voice recognition software and may include unintentional dictation errors.     Sharyn Creamer, MD 10/20/16 [phone]

## 2016-10-20 NOTE — ED Notes (Signed)
Pt returned from US

## 2016-10-22 LAB — COMPREHENSIVE METABOLIC PANEL
ALT: 37 U/L (ref 17–63)
AST: 31 U/L (ref 15–41)
Albumin: 4.4 g/dL (ref 3.5–5.0)
Alkaline Phosphatase: 67 U/L (ref 38–126)
Anion gap: 8 (ref 5–15)
BILIRUBIN TOTAL: 0.6 mg/dL (ref 0.3–1.2)
BUN: 13 mg/dL (ref 6–20)
CHLORIDE: 104 mmol/L (ref 101–111)
CO2: 24 mmol/L (ref 22–32)
Calcium: 9.3 mg/dL (ref 8.9–10.3)
Creatinine, Ser: 0.71 mg/dL (ref 0.61–1.24)
Glucose, Bld: 110 mg/dL — ABNORMAL HIGH (ref 65–99)
POTASSIUM: 4.2 mmol/L (ref 3.5–5.1)
Sodium: 136 mmol/L (ref 135–145)
TOTAL PROTEIN: 8.4 g/dL — AB (ref 6.5–8.1)

## 2017-04-04 IMAGING — US US EXTREM LOW VENOUS BILAT
1 series · 13 of 24 positions shown · non-contrast
Comparison: None.

CLINICAL DATA: 35-year-old male with a history of factor 5 Ferienhaus
and bilateral calf discomfort. Evaluate for DVT.



[Series 1: us extrem low venous bilat · 13 of 58 slices shown]
[im 1/58]
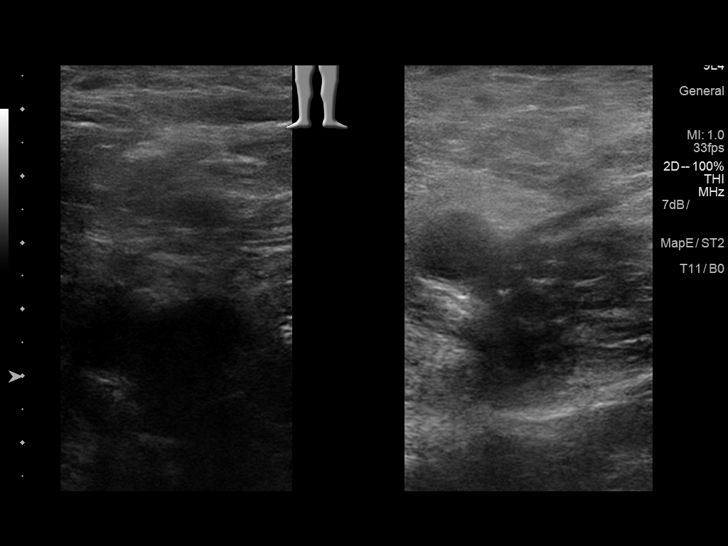
[im 5/58]
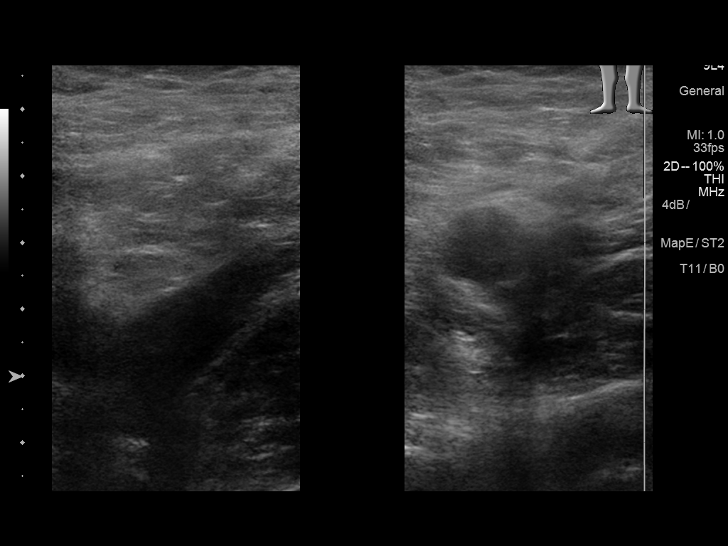
[im 10/58]
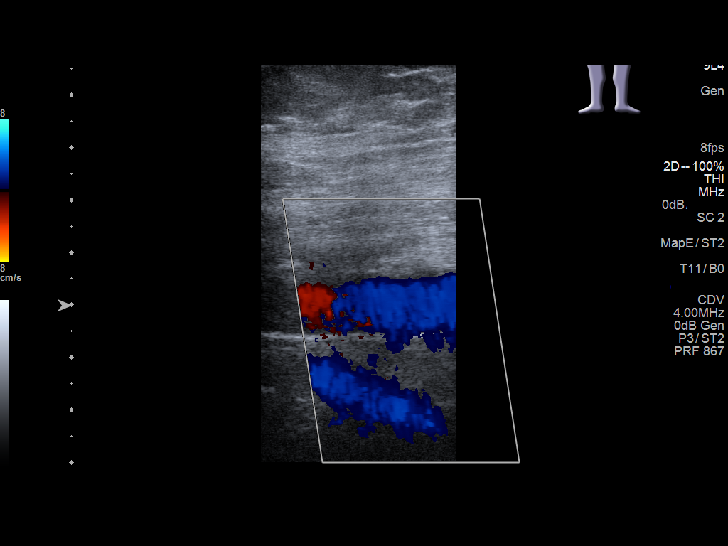
[im 15/58]
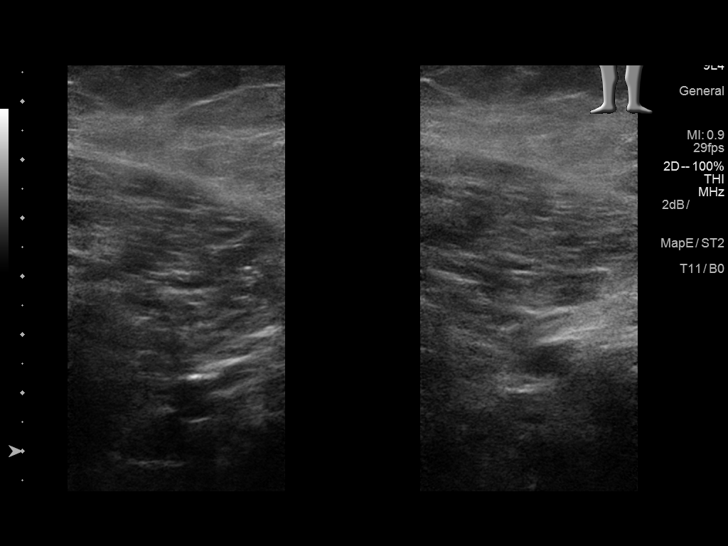
[im 20/58]
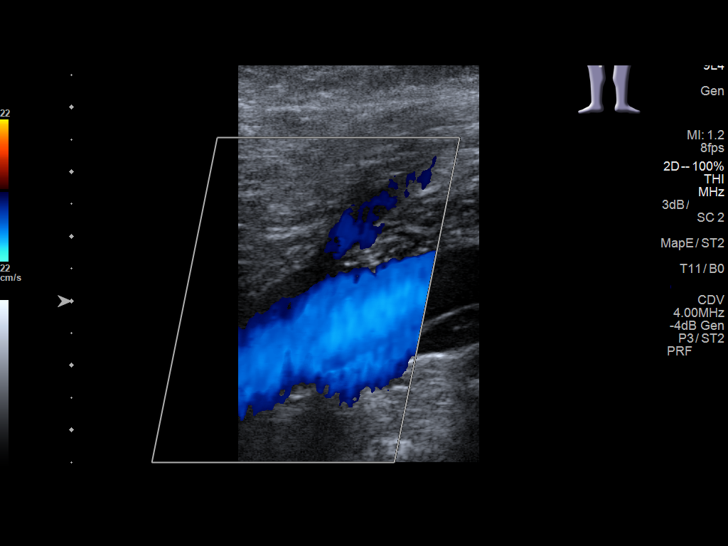
[im 25/58]
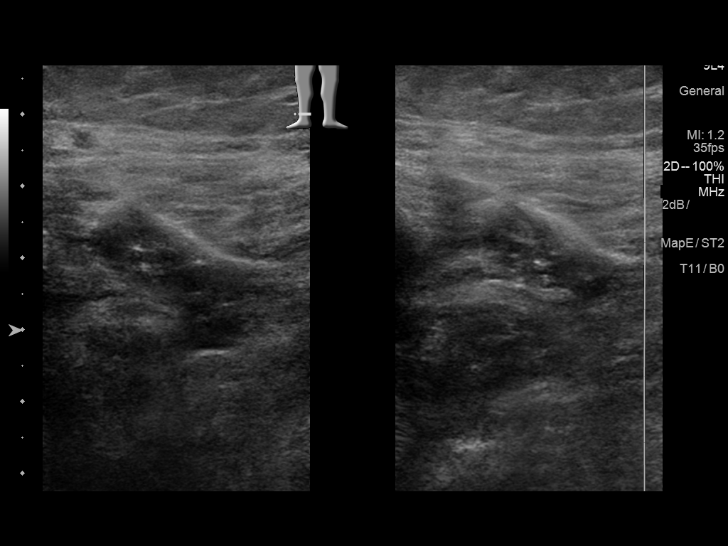
[im 30/58]
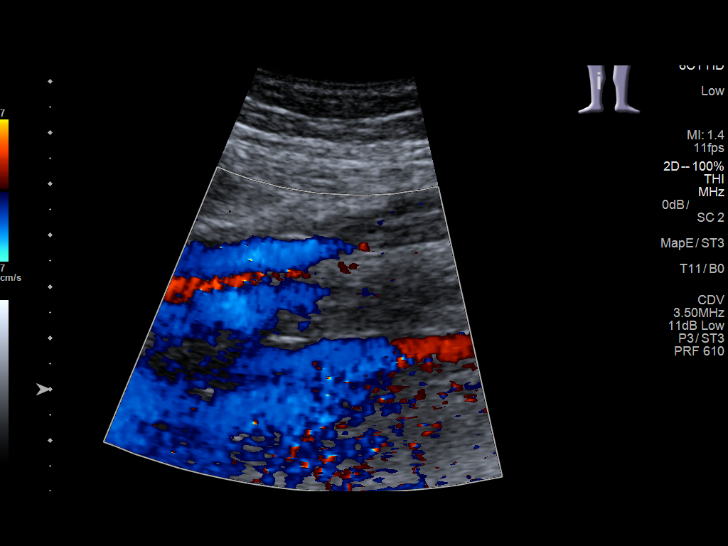
[im 33/58]
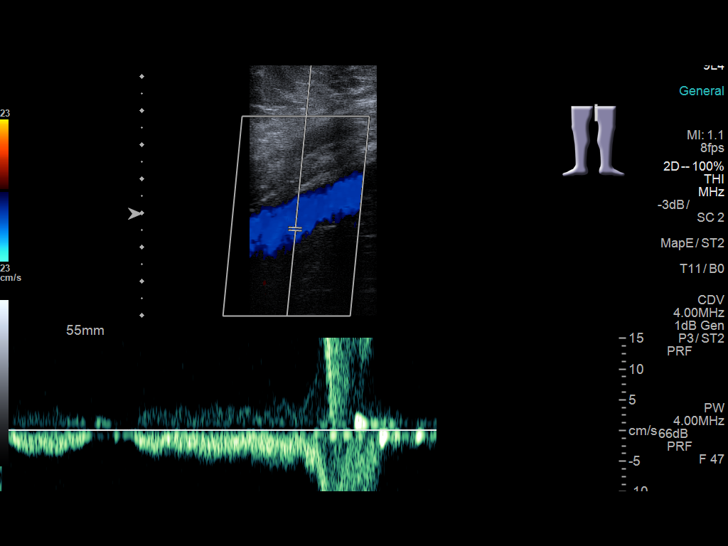
[im 38/58]
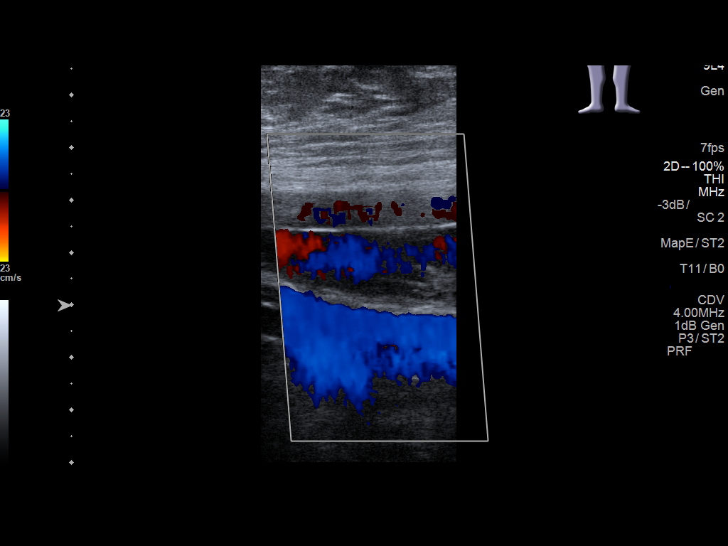
[im 43/58]
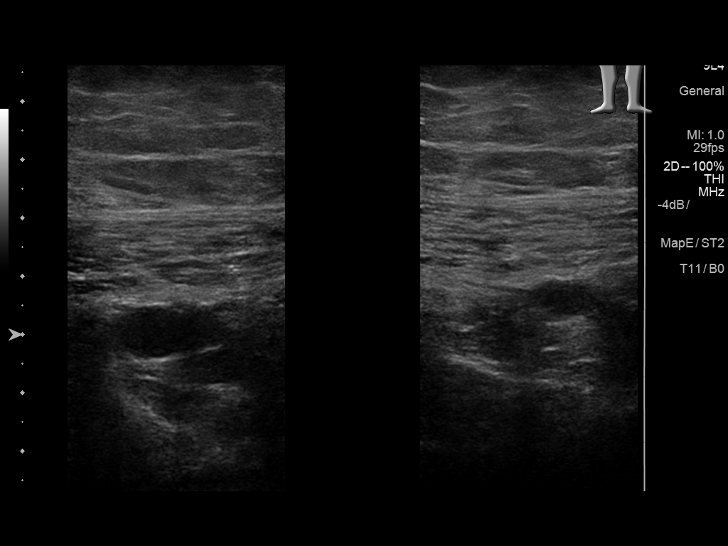
[im 48/58]
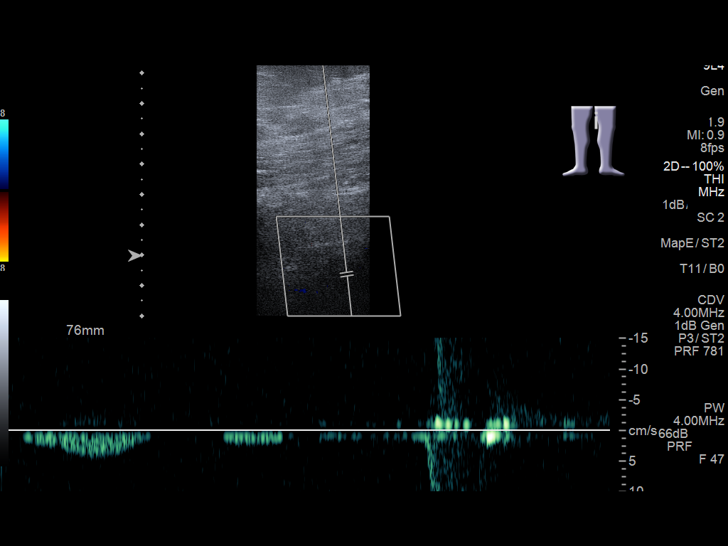
[im 53/58]
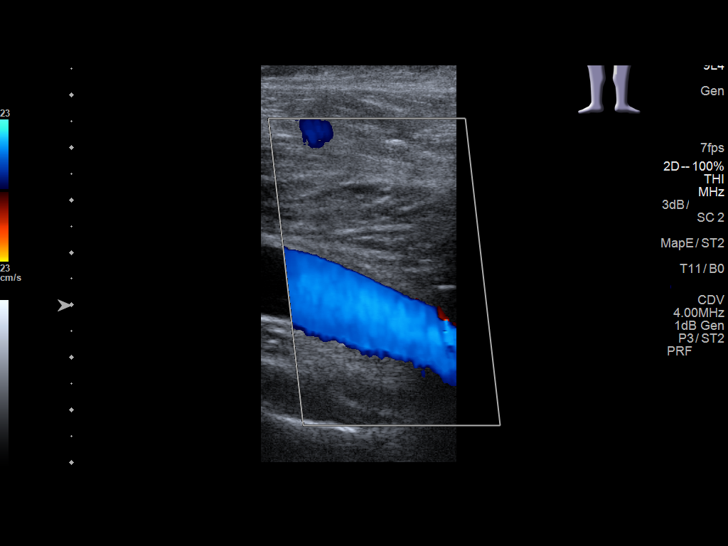
[im 58/58]
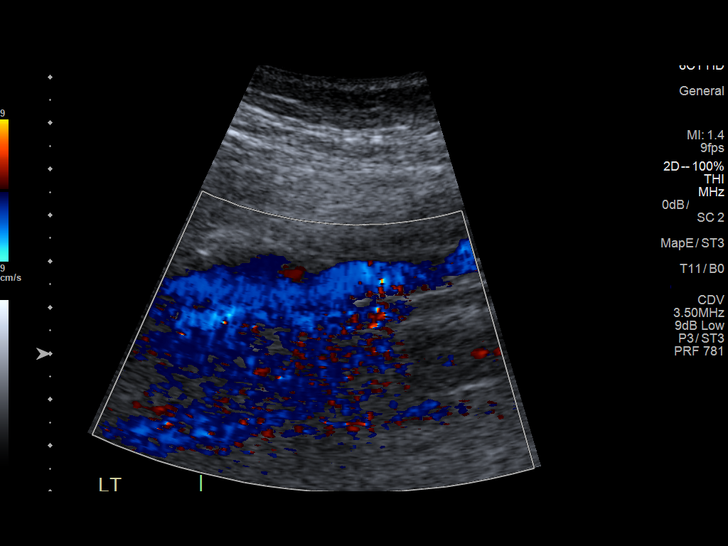

[13 of 24 positions shown; findings below may reference images not displayed]

FINDINGS: RIGHT LOWER EXTREMITY

Common Femoral Vein: No evidence of thrombus. Normal
compressibility, respiratory phasicity and response to augmentation.

Saphenofemoral Junction: No evidence of thrombus. Normal
compressibility and flow on color Doppler imaging.

Profunda Femoral Vein: No evidence of thrombus. Normal
compressibility and flow on color Doppler imaging.

Femoral Vein: No evidence of thrombus. Normal compressibility,
respiratory phasicity and response to augmentation.

Popliteal Vein: No evidence of thrombus. Normal compressibility,
respiratory phasicity and response to augmentation.

Calf Veins: No evidence of thrombus. Normal compressibility and flow
on color Doppler imaging.

Superficial Great Saphenous Vein: No evidence of thrombus. Normal
compressibility and flow on color Doppler imaging.

Venous Reflux:  None.

Other Findings:  None.

LEFT LOWER EXTREMITY

Common Femoral Vein: No evidence of thrombus. Normal
compressibility, respiratory phasicity and response to augmentation.

Saphenofemoral Junction: No evidence of thrombus. Normal
compressibility and flow on color Doppler imaging.

Profunda Femoral Vein: No evidence of thrombus. Normal
compressibility and flow on color Doppler imaging.

Femoral Vein: No evidence of thrombus. Normal compressibility,
respiratory phasicity and response to augmentation.

Popliteal Vein: No evidence of thrombus. Normal compressibility,
respiratory phasicity and response to augmentation.

Calf Veins: No evidence of thrombus. Normal compressibility and flow
on color Doppler imaging.

Superficial Great Saphenous Vein: No evidence of thrombus. Normal
compressibility and flow on color Doppler imaging.

Venous Reflux:  None.

Other Findings:  None.
IMPRESSION: No evidence of deep venous thrombosis.

## 2020-09-17 ENCOUNTER — Other Ambulatory Visit: Payer: Self-pay

## 2020-09-17 ENCOUNTER — Encounter: Payer: Self-pay | Admitting: Family Medicine

## 2020-09-17 ENCOUNTER — Ambulatory Visit: Payer: Self-pay | Admitting: Family Medicine

## 2020-09-17 ENCOUNTER — Ambulatory Visit: Payer: Self-pay

## 2020-09-17 DIAGNOSIS — Z113 Encounter for screening for infections with a predominantly sexual mode of transmission: Secondary | ICD-10-CM

## 2020-09-17 LAB — GRAM STAIN
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
## 2020-09-21 ENCOUNTER — Telehealth: Payer: Self-pay | Admitting: Licensed Clinical Social Worker

## 2020-09-21 LAB — GONOCOCCUS CULTURE

## 2020-09-21 NOTE — Telephone Encounter (Signed)
-----   Message from Wendi Snipes, FNP sent at 09/17/2020  5:50 PM EDT ----- Regarding: Referral Lanae Boast,   His patient is interested in your services he has a history of anxiety and depression.  The best contact number is [phone] for him.    Thanks, [employer]

## 2020-09-24 LAB — HEPATITIS B SURFACE ANTIGEN: Hepatitis B Surface Ag: NEGATIVE

## 2020-10-14 ENCOUNTER — Ambulatory Visit: Payer: Medicaid Other | Admitting: Licensed Clinical Social Worker
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
## 2021-01-12 ENCOUNTER — Encounter: Payer: Self-pay | Admitting: Licensed Clinical Social Worker

## 2021-01-12 ENCOUNTER — Ambulatory Visit: Payer: Medicaid Other | Admitting: Licensed Clinical Social Worker

## 2021-01-12 DIAGNOSIS — F4310 Post-traumatic stress disorder, unspecified: Secondary | ICD-10-CM

## 2021-01-12 DIAGNOSIS — F331 Major depressive disorder, recurrent, moderate: Secondary | ICD-10-CM
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
## 2021-01-19 ENCOUNTER — Ambulatory Visit: Payer: Medicaid Other | Admitting: Licensed Clinical Social Worker

## 2021-01-19 DIAGNOSIS — F4310 Post-traumatic stress disorder, unspecified: Secondary | ICD-10-CM

## 2021-01-19 DIAGNOSIS — F331 Major depressive disorder, recurrent, moderate: Secondary | ICD-10-CM

## 2021-01-19 NOTE — Progress Notes (Signed)
Counselor/Therapist Progress Note  Patient ID: Luis Bowen, MRN: [id number],    Date: 01/19/2021  Time Spent: 50 minutes    Treatment Type: Psychotherapy  Reported Symptoms: Feelings of Worthlessness, Hopelessness, Panic attacks, Obsessive thinking, Anhedonia, Sleep disturbance and depressed mood, passive SI no intent or plan, anxiety  Mental Status Exam:  Appearance:   Casual, Neat and Well Groomed     Behavior:  Appropriate and Sharing  Motor:  Normal  Speech/Language:   Normal Rate  Affect:  Appropriate, Congruent and Full Range  Mood:  dysthymic  Thought process:  normal  Thought content:    WNL  Sensory/Perceptual disturbances:    WNL  Orientation:  oriented to person, place, time/date, situation and day of week  Attention:  Good  Concentration:  Good  Memory:  WNL  Fund of knowledge:   Good  Insight:    Fair  Judgment:   Fair  Impulse Control:  Fair   Risk Assessment: Danger to Self:  No Self-injurious Behavior: No Danger to Others: No Duty to Warn:no Physical Aggression / Violence:No  Access to Firearms a concern: No  Gang Involvement:No   Subjective: Patient was engaged and cooperative throughout the session using time effectively to discuss thoughts, feelings, and treatment plan. Patient voices continued motivation for treatment and understanding of mental health challenges. Patient is likely to benefit from future treatment because he is motivated to decrease symptoms of depression, PTSD, and anxiety.   Interventions: Cognitive Behavioral Therapy  Checked in with patient and reviewed previous session, including assessment and goal of treatment. Reviewed CBTs. Explored patient's goal of treatment and worked collaboratively to develop CBT treatment plan. Provided psychoeducation on Emotions and their associated thoughts, body reactions, and behaviors. Also discussed med management. Provided support through active listening, validation of feelings, and highlighted  patient's strengths.  Diagnosis:   ICD-10-CM   1. Major depressive disorder, recurrent episode, moderate (HCC)  F33.1   2. PTSD (post-traumatic stress disorder)  F43.10     Plan: Provided resources on medication management  Goal is to be able to go to sleep without being terrified - due to recurring nightmares.   Treatment Target: Understand the relationship between thoughts, emotions, and behaviors  - Psychoeducation on CBT model   - Teach the connection between thoughts, emotions, and behaviors  Treatment Target: Increase realistic balanced thinking -to learn how to replace thinking with thoughts that are more accurate or helpful - Explore patient's thoughts, beliefs, automatic thoughts, assumptions  - Identify hot thoughts (upsetting ideas, self-talk and mental images) - Identify and replace thinking that leads to depression - Process distress and allow for emotional release  - Process distress related to trauma  - Cognitive reframing  - Questioning and challenging thoughts - Evaluate thoughts - Provided psychoeducation on core beliefs, explore, and assist patient in identifying core beliefs and modify underlying beliefs  Treatment Target: Increase coping skills - Mindfulness practices  - Deep breathing  - Grounding techniques as necessary  - STOP technique Treatment Target: Reducing vulnerability to "emotional mind" - Values clarification   - Self-care - nutrition, sleep hygiene, exercise  - Increase positive and meaningful events   Future Appointments  Date Time Provider Department Center  01/26/2021  9:00 AM Kathreen Cosier, LCSW [facility] None    Kathreen Cosier, LCSW

## 2021-01-26 ENCOUNTER — Ambulatory Visit: Payer: Medicaid Other | Admitting: Licensed Clinical Social Worker

## 2021-01-26 DIAGNOSIS — F331 Major depressive disorder, recurrent, moderate: Secondary | ICD-10-CM

## 2021-01-26 DIAGNOSIS — F4310 Post-traumatic stress disorder, unspecified: Secondary | ICD-10-CM

## 2021-01-26 NOTE — Progress Notes (Signed)
Counselor/Therapist Progress Note  Patient ID: Luis Bowen, MRN: [id number],    Date: 01/26/2021  Time Spent: 50 minutes   Treatment Type: Psychotherapy  Reported Symptoms: Feelings of Worthlessness, Hopelessness, Panic attacks, Sleep disturbance and anxiety, anxious thoughts, depressed mood   Mental Status Exam:  Appearance:   Casual and Well Groomed     Behavior:  Appropriate and Sharing  Motor:  Normal  Speech/Language:   Clear and Coherent and Normal Rate  Affect:  Appropriate, Congruent and Full Range  Mood:  normal  Thought process:  normal  Thought content:    WNL  Sensory/Perceptual disturbances:    WNL  Orientation:  oriented to person, place, time/date and situation  Attention:  Fair  Concentration:  Fair  Memory:  WNL  Fund of knowledge:   Good  Insight:    Fair  Judgment:   Fair  Impulse Control:  Fair   Risk Assessment: Danger to Self:  No Self-injurious Behavior: No Danger to Others: No Duty to Warn:no Physical Aggression / Violence:No  Access to Firearms a concern: No  Gang Involvement:No   Subjective: Patient was engaged and cooperative throughout the session using time effectively to discuss thoughts, feelings, and medication management. Patient voices continued motivation for treatment and understanding of depression and anxiety issues. Patient is likely to benefit from future treatment because he remains motivated to decrease symptoms and improve functioning.    Interventions: Cognitive Behavioral Therapy Checked in with patient regarding his week. Reviewed previous session regarding medication management and provided resources and also reviewed the connection between thoughts, feelings, physical sensations, and behaviors. Provided Psychoeducation on mindfulness, engaged patient in mindfulness exercise, processed exercise, and contracted with patient to complete daily. Provided support through active listening, validation of feelings, and highlighted  patient's strengths.   Diagnosis:   ICD-10-CM   1. Major depressive disorder, recurrent episode, moderate (HCC)  F33.1   2. PTSD (post-traumatic stress disorder)  F43.10    Plan: Check in on mindfulness 1 minute exercises. Continue mindfulness.  Goalis to be able to go to sleep without being terrified- due to recurring nightmares.  Treatment Target: Understand the relationship between thoughts, emotions, and behaviors   Psychoeducation on CBT model    Teach the connection between thoughts, emotions, and behaviors  Treatment Target: Increase realistic balanced thinking -to learn how to replace thinking with thoughts that are more accurate or helpful  Explore patient's thoughts, beliefs, automatic thoughts, assumptions   Identify hot thoughts(upsetting ideas, self-talk and mental images)  Identify and replace thinking that leads to depression  Process distress and allow for emotional release   Process distress related to trauma   Cognitive reframing   Questioning and challenging thoughts  Evaluate thoughts  Provided psychoeducation on core beliefs, explore, and assist patient in identifying core beliefs and modify underlying beliefs  Treatment Target: Increase coping skills  Mindfulness practices   Deep breathing   Grounding techniques as necessary   STOP technique Treatment Target: Reducing vulnerability to "emotional mind"  Values clarification    Self-care - nutrition, sleep hygiene, exercise   Increase positive and meaningful events   Future Appointments  Date Time Provider Department Center  02/02/2021 11:00 AM Kathreen Cosier, LCSW [facility] None    Kathreen Cosier, LCSW

## 2021-02-02 ENCOUNTER — Ambulatory Visit: Payer: Medicaid Other | Admitting: Licensed Clinical Social Worker

## 2021-02-02 DIAGNOSIS — F4310 Post-traumatic stress disorder, unspecified: Secondary | ICD-10-CM

## 2021-02-02 DIAGNOSIS — F331 Major depressive disorder, recurrent, moderate: Secondary | ICD-10-CM

## 2021-02-02 NOTE — Progress Notes (Signed)
Counselor/Therapist Progress Note  Patient ID: Houa Ackert, MRN: [id number],    Date: 02/02/2021  Time Spent: 1 hour   Treatment Type: Psychotherapy  Reported Symptoms: Feelings of Worthlessness, Hopelessness, Obsessive thinking, Anhedonia and depressed mood, suicidal ideation, deneis intent, has method without plan   Mental Status Exam:  Appearance:   Casual, Neat and Well Groomed     Behavior:  Appropriate and Sharing  Motor:  Normal  Speech/Language:   Clear and Coherent and Normal Rate  Affect:  Appropriate, Congruent and Full Range  Mood:  dysthymic  Thought process:  normal  Thought content:    WNL  Sensory/Perceptual disturbances:    WNL  Orientation:  oriented to person, place, time/date, situation and day of week  Attention:  Good  Concentration:  Fair  Memory:  WNL  Fund of knowledge:   Good  Insight:    Fair  Judgment:   Fair  Impulse Control:  Good   Risk Assessment: Danger to Self:  Yes.  without intent/plan Self-injurious Behavior: No Danger to Others: No Duty to Warn:no Physical Aggression / Violence:No  Access to Firearms a concern: No  Gang Involvement:No   Subjective: Patient was engaged and cooperative throughout the session using time to discuss thoughts and feelings. Patient voices continued motivation for treatment and understanding of depression and anxiety issues. Patient is likely to benefit from future treatment because he remains motivated to decrease depression and anxiety symptoms and reports some benefit of regular sessions.  Patient agrees to go to [facility] today to be evaluated or medication management. Patient has passive suicidal ideation he denies any current intent, plan or means (no wood) - he reports thinking about suicide and has thought about a method - burning self in a bonfire. He agrees to safety plan that includes, going to [facility] today, spending the weekend with his wife as their anniversary is on Monday, check in call with LCSW on Tuesday  the 15th, and to use crisis line if needed.   Interventions: Cognitive Behavioral Therapy and Mindfulness Meditation Established psychological safety. Checked in with patient regarding his week. Engaged patient in processing current psychosocial stressors, increased depressive symptoms due to multiple stressors. Explored patient's perceptions validating feelings and reframing unhelpful thoughts leading to increased distress. Assessed patient for suicidality, and developed a safety plan as described above. Patient is in agreement to be at schedule appointment on 02/11/20 and agrees to use crisis line if needed. Provided support through active listening, validation of feelings, and highlighted patient's strengths.   Diagnosis:   ICD-10-CM   1. Major depressive disorder, recurrent episode, moderate (HCC)  F33.1   2. PTSD (post-traumatic stress disorder)  F43.10    Plan:  Assess for safety. Check in on mindfulness 1 minute exercises. Continue mindfulness.  Goalis to be able to go to sleep without being terrified- due to recurring nightmares.  Treatment Target: Understand the relationship between thoughts, emotions, and behaviors   Psychoeducation on CBT model   Teach the connection between thoughts, emotions, and behaviors  Treatment Target: Increase realistic balanced thinking -to learn how to replace thinking with thoughts that are more accurate or helpful  Explore patient's thoughts, beliefs, automatic thoughts, assumptions   Identify hot thoughts(upsetting ideas, self-talk and mental images)  Identify and replace thinking that leads to depression  Process distress and allow for emotional release   Process distress related to trauma   Cognitive reframing   Questioning and challenging thoughts  Evaluate thoughts  Provided psychoeducation on core beliefs, explore,  and assist patient in identifying core beliefs and modify underlying beliefs  Treatment Target: Increase coping  skills  Mindfulness practices   Deep breathing   Grounding techniques as necessary   STOP technique Treatment Target: Reducing vulnerability to "emotional mind"  Values clarification   Self-care -nutrition, sleep hygiene, exercise   Increase positive and meaningful events  Future Appointments  Date Time Provider Department Center  02/10/2021 10:00 AM Kathreen Cosier, LCSW [facility] None    Kathreen Cosier, LCSW

## 2021-02-08 ENCOUNTER — Telehealth: Payer: Self-pay | Admitting: Licensed Clinical Social Worker

## 2021-02-08 NOTE — Telephone Encounter (Signed)
LCSW called to check in with patient due to safety plan we created at last session. Patient voices things have been going well, denies current suicidal ideation, plan or intent. Patient reports that he is currently in the [facility] parking lot to follow up on plans discussed in regard to patient being evaluated for psychiatric medication management.

## 2021-02-10 ENCOUNTER — Ambulatory Visit: Payer: Medicaid Other | Admitting: Licensed Clinical Social Worker

## 2021-02-10 DIAGNOSIS — F4310 Post-traumatic stress disorder, unspecified: Secondary | ICD-10-CM

## 2021-02-10 DIAGNOSIS — F331 Major depressive disorder, recurrent, moderate: Secondary | ICD-10-CM

## 2021-02-10 NOTE — Progress Notes (Signed)
Counselor/Therapist Progress Note  Patient ID: Luis Bowen, MRN: [id number],    Date: 02/10/2021  Time Spent: 40 minutes   Treatment Type: Psychotherapy  Reported Symptoms: denies current suicidal ideation, mild mood improvement over the last week   Mental Status Exam:  Appearance:   Casual, Neat and Well Groomed     Behavior:  Appropriate and Sharing  Motor:  Normal  Speech/Language:   Clear and Coherent and Normal Rate  Affect:  Appropriate, Congruent and Full Range  Mood:  anxious  Thought process:  loose associations  Thought content:    WNL  Sensory/Perceptual disturbances:    WNL  Orientation:  oriented to person, place, time/date and situation  Attention:  Fair  Concentration:  Fair  Memory:  WNL  Fund of knowledge:   Good  Insight:    Good  Judgment:   Good  Impulse Control:  Good   Risk Assessment: Danger to Self:  No Self-injurious Behavior: No Danger to Others: No Duty to Warn:no Physical Aggression / Violence:No  Access to Firearms a concern: No  Gang Involvement:No   Subjective: Patient was engaged and cooperative throughout the session using time effectively to discuss thoughts, feelings, and coping skills. Patient voices continued motivation for treatment and understanding of depression and anxiety issues. Patient is likely to benefit from future treatment because he is motivated to decrease depressive symptoms and anxiety and reports some benefit of regular sessions in addressing these symptoms. Patient also reports that he is seeking medication management evaluation through [facility] this Friday.   Interventions: Cognitive Behavioral Therapy and Mindfulness Meditation Checked in with patient regarding his week. Reviewed previous session regarding suicidal ideation and assessed for safety. Checked in on use of mindfulness. Continue teaching patient about mindfulness and generalizing its practice into daily activitites. Began teaching patient Unhelpful Thinking  Styles - stopped at Mental Filter. Provided support through active listening, validation of feelings, and highlighted patient's strengths.   Diagnosis:   ICD-10-CM   1. Major depressive disorder, recurrent episode, moderate (HCC)  F33.1   2. PTSD (post-traumatic stress disorder)  F43.10    Plan: Continue mindfulness.Continue to teach Unhelpful Thinking Styles.  Goalis to be able to go to sleep without being terrified- due to recurring nightmares.  Treatment Target: Understand the relationship between thoughts, emotions, and behaviors   Psychoeducation on CBT model   Teach the connection between thoughts, emotions, and behaviors  Treatment Target: Increase realistic balanced thinking -to learn how to replace thinking with thoughts that are more accurate or helpful  Explore patient's thoughts, beliefs, automatic thoughts, assumptions   Identify hot thoughts(upsetting ideas, self-talk and mental images)  Identify and replace thinking that leads to depression  Process distress and allow for emotional release   Process distress related to trauma   Cognitive reframing   Questioning and challenging thoughts  Evaluate thoughts  Provided psychoeducation on core beliefs, explore, and assist patient in identifying core beliefs and modify underlying beliefs  Treatment Target: Increase coping skills  Mindfulness practices for attention training   Deep breathing   Grounding techniques as necessary   STOP technique Treatment Target: Reducing vulnerability to "emotional mind"  Values clarification   Self-care -nutrition, sleep hygiene, exercise   Increase positive and meaningful events  Future Appointments  Date Time Provider Department Center  02/23/2021 11:30 AM Kathreen Cosier, LCSW [facility] None    Kathreen Cosier, LCSW

## 2021-02-23 ENCOUNTER — Ambulatory Visit: Payer: Medicaid Other | Admitting: Licensed Clinical Social Worker

## 2021-02-23 DIAGNOSIS — F4310 Post-traumatic stress disorder, unspecified: Secondary | ICD-10-CM

## 2021-02-23 DIAGNOSIS — F331 Major depressive disorder, recurrent, moderate: Secondary | ICD-10-CM

## 2021-02-23 NOTE — Progress Notes (Signed)
Counselor/Therapist Progress Note  Patient ID: Luis Bowen, MRN: [id number],    Date: 02/23/2021   Time Spent: 45 minutes   Treatment Type: Psychotherapy  Reported Symptoms: depressed, hopeless, increased sleep, denies suicidal ideation, low motivation   Mental Status Exam:  Appearance:   Casual, Neat and Well Groomed   Behavior:  Appropriate and Sharing, resistance   Motor:  Normal  Speech/Language:   Clear and Coherent and Normal Rate  Affect:  Appropriate, Congruent and Full Range  Mood:  anxious  Thought process:  loose associations  Thought content:   WNL  Sensory/Perceptual disturbances:   WNL  Orientation:  oriented to person, place, time/date and situation  Attention:  Fair  Concentration:  Fair  Memory:  WNL  Fund of knowledge:   Good  Insight:   Good  Judgment:   Good  Impulse Control:  Good   Risk Assessment: Danger to Self: No Self-injurious Behavior: No Danger to Others: No Duty to Warn:no Physical Aggression / Violence:No  Access to Firearms a concern: No  Gang Involvement:No   Subjective: Patient was engaged but did require redirection through the session. Patient used time effectively to discuss thoughts, feelings, and coping skills. Patient voices continued motivation for treatment and understanding of depression and anxiety issues. Patient is likely to benefit from future treatment because he is motivated to decrease depressive symptoms and anxiety and reports some benefit of regular sessions in addressing these symptoms. Patient also reports that he is seeking medication management evaluation through [facility] on Monday 02/28/21.   Interventions: Cognitive Behavioral Therapy and Mindfulness Meditation Checked in with patient regarding his week. Reviewed unhelpful thoughts charting out patients situation, thoughts, feelings, and behviors using ABCD worksheet. Contracted with patient to complete ABCD worksheet as homework task.  Provided support through active listening, validation of feelings, and highlighted patient's strengths.   Diagnosis:   ICD-10-CM   1. Major depressive disorder, recurrent episode, moderate (HCC)  F33.1   2. PTSD (post-traumatic stress disorder)  F43.10    Plan: Check in on homework ABCD method. And continue mindfulness/generalizations.  Goalis to be able to go to sleep without being terrified- due to recurring nightmares.  Treatment Target: Understand the relationship between thoughts, emotions, and behaviors   Psychoeducation on CBT model   Teach the connection between thoughts, emotions, and behaviors  Treatment Target: Increase realistic balanced thinking -to learn how to replace thinking with thoughts that are more accurate or helpful  Explore patient's thoughts, beliefs, automatic thoughts, assumptions   Identify hot thoughts(upsetting ideas, self-talk and mental images)  Identify and replace thinking that leads to depression  Process distress and allow for emotional release   Process distress related to trauma   Cognitive reframing   Questioning and challenging thoughts  Evaluate thoughts  Provided psychoeducation on core beliefs, explore, and assist patient in identifying core beliefs and modify underlying beliefs  Treatment Target: Increase coping skills  Mindfulness practices for attention training   Deep breathing   Grounding techniques as necessary   STOP technique Treatment Target: Reducing vulnerability to "emotional mind"  Values clarification   Self-care -nutrition, sleep hygiene, exercise   Increase positive and meaningful events  Future Appointments  Date Time Provider Department Center  03/09/2021 10:00 AM Kathreen Cosier, LCSW [facility] None    Kathreen Cosier, LCSW

## 2021-03-09 ENCOUNTER — Ambulatory Visit: Payer: Medicaid Other | Admitting: Licensed Clinical Social Worker

## 2021-03-15 ENCOUNTER — Other Ambulatory Visit: Payer: Self-pay

## 2021-03-15 MED ORDER — FLUOXETINE HCL 20 MG PO CAPS
ORAL_CAPSULE | ORAL | 1 refills | Status: DC
  Filled 2021-03-15: qty 30, 30d supply, fill #0
  Filled 2021-04-29: qty 30, 30d supply, fill #1

## 2021-03-16 ENCOUNTER — Other Ambulatory Visit: Payer: Self-pay

## 2021-03-17 ENCOUNTER — Emergency Department
Admission: EM | Admit: 2021-03-17 | Discharge: 2021-03-17 | Disposition: A | Discharge status: Home / Self Care | Payer: PRIVATE HEALTH INSURANCE | Attending: Emergency Medicine | Admitting: Emergency Medicine

## 2021-03-17 ENCOUNTER — Other Ambulatory Visit: Payer: Self-pay

## 2021-03-17 ENCOUNTER — Ambulatory Visit: Payer: Medicaid Other | Admitting: Licensed Clinical Social Worker

## 2021-03-17 DIAGNOSIS — F4310 Post-traumatic stress disorder, unspecified: Secondary | ICD-10-CM

## 2021-03-17 DIAGNOSIS — K029 Dental caries, unspecified: Secondary | ICD-10-CM | POA: Insufficient documentation

## 2021-03-17 DIAGNOSIS — Z7982 Long term (current) use of aspirin: Secondary | ICD-10-CM | POA: Insufficient documentation

## 2021-03-17 DIAGNOSIS — F331 Major depressive disorder, recurrent, moderate: Secondary | ICD-10-CM

## 2021-03-17 DIAGNOSIS — Z87891 Personal history of nicotine dependence: Secondary | ICD-10-CM | POA: Insufficient documentation

## 2021-03-17 MED ORDER — AMOXICILLIN-POT CLAVULANATE 875-125 MG PO TABS: 1.0000 | ORAL_TABLET | Freq: Two times a day (BID) | ORAL | 0 refills | Status: AC

## 2021-03-17 MED ORDER — LIDOCAINE VISCOUS HCL 2 % MT SOLN: 15.0000 mL | OROMUCOSAL | 0 refills | Status: DC | PRN

## 2021-03-17 MED ORDER — LIDOCAINE VISCOUS HCL 2 % MT SOLN
15.0000 mL | Freq: Once | OROMUCOSAL | Status: AC
  Administered 2021-03-17: 15 mL via OROMUCOSAL
  Filled 2021-03-17: qty 15

## 2021-03-17 MED ORDER — AMOXICILLIN-POT CLAVULANATE 875-125 MG PO TABS
1.0000 | ORAL_TABLET | Freq: Once | ORAL | Status: AC
  Administered 2021-03-17: 1 via ORAL
  Filled 2021-03-17: qty 1

## 2021-03-17 NOTE — Progress Notes (Signed)
Counselor/Therapist Progress Note  Patient ID: Luis Bowen, MRN: [id number],    Date: 03/17/2021  Time Spent: 45 minutes    Treatment Type: Psychotherapy  Reported Symptoms: Physical aches and pain and overall stable mood, decrease in sleep disturbance  Mental Status Exam:  Appearance:   Casual and Well Groomed     Behavior:  Appropriate and Sharing  Motor:  Normal  Speech/Language:   Clear and Coherent and Normal Rate  Affect:  Appropriate, Congruent and Full Range  Mood:  euthymic  Thought process:  loose associations  Thought content:    WNL  Sensory/Perceptual disturbances:    WNL  Orientation:  oriented to person, place, time/date and situation  Attention:  Good  Concentration:  Good  Memory:  WNL  Fund of knowledge:   Good  Insight:    Good  Judgment:   Good  Impulse Control:  Good   Risk Assessment: Danger to Self:  No Self-injurious Behavior: No Danger to Others: No Duty to Warn:no Physical Aggression / Violence:No  Access to Firearms a concern: No  Gang Involvement:No   Subjective: Patient was engaged and cooperative throughout the session using time effectively to discuss thoughts, feelings and transition plan. Patient voices continued motivation for treatment and understanding of depression issues. Patient is recommended to continue treatment and follow recommendations of [facility].    Interventions: Cognitive Behavioral Therapy Checked in with patient regarding his week. Reviewed previous session regarding following up with medication management evaluation. Discussed transition of care to [facility]. Reviewed progress and current symptoms and psychosocial stressors. Provided support through active listening, validation of feelings, and highlighted patient's strengths.   Diagnosis:   ICD-10-CM   1. Major depressive disorder, recurrent episode, moderate (HCC)  F33.1   2. PTSD (post-traumatic stress disorder)  F43.10    Plan: patient plans to transition care to [facility].     Kathreen Cosier, LCSW

## 2021-03-17 NOTE — Discharge Instructions (Addendum)
Take the antibiotics as prescribed. Use viscous lidocaine as prescribed as needed for pain. You may also use ibuprofen (600mg  3x daily) and tylenol (up to 1000mg  4x daily) as needed for pain.  OPTIONS FOR DENTAL FOLLOW UP CARE  [place] [facility] of Health and Human Services - Local Safety Net Dental Clinics .htm   [facility] [phone])  [facility] [phone])  [place] [phone])  [facility] [phone])  [facility] [phone]) This clinic caters to the indigent population and is on a lottery system. Location: [facility] of Dentistry, ([phone], [address] [place] Clinic Hours: Wednesdays from 6pm - 9pm, patients seen by a lottery system. For dates, call or go to [place] Services: Cleanings, fillings and simple extractions. Payment Options: DENTAL WORK IS FREE OF CHARGE. Bring proof of income or support. Best way to get seen: Arrive at 5:15 pm - this is a lottery, NOT first come/first serve, so arriving earlier will not increase your chances of being seen.     [facility] [phone] Select option 1 for emergencies   Location: [facility], [place], [address] [place] Clinic Hours: No walk-ins accepted - call the day before to schedule an appointment. Check in times are 9:30 am and 1:30 pm. Services: Simple extractions, temporary fillings, pulpectomy/pulp debridement, uncomplicated abscess drainage. Payment Options: PAYMENT IS DUE AT THE TIME OF SERVICE.  Fee is usually $100-200, additional surgical procedures (e.g. abscess drainage) may be extra. Cash, checks, Visa/MasterCard accepted.  Can file Medicaid if patient is covered for dental - patient should call case worker to check. No discount for [facility]  patients. Best way to get seen: MUST call the day before and get onto the schedule. Can usually be seen the next 1-2 days. No walk-ins accepted.     [facility] Services [phone]   Location: [facility], [address] [place] Clinic Hours: M, W, Th, F 8am or 1:30pm, Tues 9a or 1:30 - first come/first served. Services: Simple extractions, temporary fillings, uncomplicated abscess drainage.  You do not need to be an [facility] resident. Payment Options: PAYMENT IS DUE AT THE TIME OF SERVICE. Dental insurance, otherwise sliding scale - bring proof of income or support. Depending on income and treatment needed, cost is usually $50-200. Best way to get seen: Arrive early as it is first come/first served.     [facility] [phone]   Location: [address] Clinic Hours: Mon-Thu 8a-5p Services: Most basic dental services including extractions and fillings. Payment Options: PAYMENT IS DUE AT THE TIME OF SERVICE. Sliding scale, up to 50% off - bring proof if income or support. Medicaid with dental option accepted. Best way to get seen: Call to schedule an appointment, can usually be seen within 2 weeks OR they will try to see walk-ins - show up at 8a or 2p (you may have to wait).     [facility] [phone] [place] RESIDENTS ONLY   Location: [facility], [address] [place], [address] Clinic Hours: By appointment only. Monday - Thursday 8am-5pm, Friday 8am-12pm Services: Cleanings, fillings, extractions. Payment Options: PAYMENT IS DUE AT THE TIME OF SERVICE. Cash, Visa or MasterCard. Sliding scale - $30 minimum per service. Best way to get seen: Come in to office, complete packet and make an appointment - need proof of income or support monies  for each household member and proof of [facility] residence. Usually takes about a month to  get in.     [facility] [phone]   Location: [address] [facility] Clinic Hours: Walk-in Urgent Care Dental Services are offered Monday-Friday mornings only. The numbers of emergencies accepted daily is limited to the number of providers available. Maximum 15 - Mondays, Wednesdays & Thursdays Maximum 10 - Tuesdays & Fridays Services: You do not need to be a [facility] resident to be seen for a dental emergency. Emergencies are defined as pain, swelling, abnormal bleeding, or dental trauma. Walkins will receive x-rays if needed. NOTE: Dental cleaning is not an emergency. Payment Options: PAYMENT IS DUE AT THE TIME OF SERVICE. Minimum co-pay is $40.00 for uninsured patients. Minimum co-pay is $3.00 for Medicaid with dental coverage. Dental Insurance is accepted and must be presented at time of visit. Medicare does not cover dental. Forms of payment: Cash, credit card, checks. Best way to get seen: If not previously registered with the clinic, walk-in dental registration begins at 7:15 am and is on a first come/first serve basis. If previously registered with the clinic, call to make an appointment.     [facility] [phone] [place] RESIDENTS ONLY   Location: [address] [place], [place] Clinic Hours: Mon-Thu 10a-2p Services: Extractions only! Payment Options: FREE (donations accepted) - bring proof of income or support Best way to get seen: Call and schedule an appointment OR come at 8am on the 1st Monday of every month (except for holidays) when it is first come/first served.     [facility] [phone]   Location: [address] [place], [place] Clinic Hours: Friday mornings Services, Payment Options, Best way to get seen: Call for info

## 2021-03-17 NOTE — ED Provider Notes (Signed)
[facility] Emergency Department Provider Note  ____________________________________________   Event Date/Time   First MD Initiated Contact with Patient 03/17/21 1706     (approximate)  I have reviewed the triage vital signs and the nursing notes.   HISTORY  Chief Complaint Dental Pain   HPI Trentin Knappenberger is a 40 y.o. male who presents to the emergency department for evaluation of right upper dental pain.  Patient states that he has had low-grade intermittent pain over the course of several years, began worsening a few months when he thinks that his filling fell out of the tooth.  Over the last 2 weeks, he has had continued progressing of it, feels that it is now getting "warm and hot" around the tooth.  He states he is able to still eat with it.  He denies any fever, facial swelling, pain under the tongue, neck pain.  He has been taking 4 to 600 mg of ibuprofen every 4-6 hours for his pain with minimal relief.  He states he has not recently followed up with a dentist due to not having insurance.         Past Medical History:  Diagnosis Date  . ADHD (attention deficit hyperactivity disorder)   . Coital headache 03/20/2014  . Depression   . Factor 5 Leiden mutation, heterozygous (HCC)   . Hyperlipidemia   . Obese   . PTSD (post-traumatic stress disorder)     Patient Active Problem List   Diagnosis Date Noted  . Health maintenance examination 08/25/2015  . Coital headache 03/20/2014  . Pure hyperglyceridemia 08/14/2011  . Factor 5 Leiden mutation, heterozygous (HCC) 07/14/2011  . Obesity 07/14/2011  . Snoring 07/14/2011    History reviewed. No pertinent surgical history.  Prior to Admission medications   Medication Sig Start Date End Date Taking? Authorizing Provider  amoxicillin-clavulanate (AUGMENTIN) 875-125 MG tablet Take 1 tablet by mouth every 12 (twelve) hours for 10 days. 03/17/21 03/27/21 Yes Ariya Bohannon, Ruben Gottron, PA  lidocaine (XYLOCAINE)  2 % solution Use as directed 15 mLs in the mouth or throat every 4 (four) hours as needed for mouth pain. 03/17/21  Yes Lucy Chris, PA  albuterol (PROVENTIL HFA;VENTOLIN HFA) 108 (90 Base) MCG/ACT inhaler Inhale 2 puffs into the lungs every 6 (six) hours as needed for wheezing or shortness of breath. 04/11/16   Rebecka Apley, MD  aspirin 81 MG tablet Take 81 mg by mouth daily.    [provider]  fenofibrate (TRICOR) 145 MG tablet Take 1 tablet (145 mg total) by mouth daily. 08/27/15   Janeann Forehand., MD  FLUoxetine (PROZAC) 20 MG capsule TAKE ONE CAPSULE BY MOUTH ONCE DAILY IN THE MORNING. 03/15/21       Allergies Oxycodone hcl and Vicodin [hydrocodone-acetaminophen]  Family History  Problem Relation Age of Onset  . Heart disease Maternal Grandmother   . Factor V Leiden deficiency Mother   . Diabetes Mother   . Cancer Maternal Grandfather        pancreatic  . Emphysema Paternal Grandfather   . Migraines Neg Hx     Social History Social History   Tobacco Use  . Smoking status: Former Smoker    Packs/day: 0.10    Years: 1.00    Pack years: 0.10    Types: Cigarettes    Quit date: 07/14/2003    Years since quitting: 17.6  . Smokeless tobacco: Never Used  . 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 Vaping Use: Never used  Substance Use Topics  . Alcohol use: Yes    Alcohol/week: 2.0 standard drinks    Types: 2 Cans of beer per week    Comment: 1 x mon   . Drug use: No    Review of Systems Constitutional: No fever/chills Eyes: No visual changes. ENT: + Dental pain, no sore throat. Cardiovascular: Denies chest pain. Respiratory: Denies shortness of breath. Gastrointestinal: No abdominal pain.  No nausea, no vomiting.  No diarrhea.  No constipation. Genitourinary: Negative for dysuria. Musculoskeletal: Negative for back pain. Skin: Negative for rash. Neurological: Negative for headaches, focal weakness or  numbness.   ____________________________________________   PHYSICAL EXAM:  VITAL SIGNS: ED Triage Vitals  Enc Vitals Group     BP 03/17/21 1647 134/71     Pulse Rate 03/17/21 1647 72     Resp 03/17/21 1647 18     Temp 03/17/21 1647 98 F (36.7 C)     Temp src --      SpO2 03/17/21 1647 98 %     Weight 03/17/21 1646 (!) 367 lb (166.5 kg)     Height 03/17/21 1646 5\' 9"  (1.753 m)     Head Circumference --      Peak Flow --      Pain Score 03/17/21 1646 7     Pain Loc --      Pain Edu? --      Excl. in GC? --    Constitutional: Alert and oriented. Well appearing and in no acute distress. Eyes: Conjunctivae are normal. PERRL. EOMI. Head: Atraumatic. Nose: No congestion/rhinnorhea. Mouth/Throat: Multiple dental caries noted throughout the oral cavity.  No periapical abscess noted.  No pain to palpation under the tongue.  No external facial swelling or tenderness present. Neck: No stridor.   Lymphatic: No cervical lymphadenopathy Cardiovascular: Normal rate, regular rhythm. Grossly normal heart sounds.  Good peripheral circulation. Respiratory: Normal respiratory effort.  No retractions. Lungs CTAB. Neurologic:  Normal speech and language. No gross focal neurologic deficits are appreciated. No gait instability. Skin:  Skin is warm, dry and intact. No rash noted. Psychiatric: Mood and affect are normal. Speech and behavior are normal.  ____________________________________________   INITIAL IMPRESSION / ASSESSMENT AND PLAN / ED COURSE  As part of my medical decision making, I reviewed the following data within the electronic MEDICAL RECORD NUMBER Nursing notes reviewed and incorporated and Notes from prior ED visits      Patient is a 40 year old male who presents to the emergency department for evaluation of dental pain has been present for the last several years, worsening over the last 2 weeks.  He states that now feels warm/hot around the tooth.  He denies any facial pain,  erythema, denies any fever.  See HPI for further details.  On physical exam, the patient is afebrile, he does not have any noted periapical abscess, though there are multiple dental caries throughout the mouth.  Will initiate trial of antibiotics for suspicion of dental infection.  We will also provide topical viscous lidocaine for pain relief.  Recommended alternating Tylenol and ibuprofen and he was given appropriate dosages of each.  Return precautions were discussed and the patient was given a handout of the local free/reduced cost dental clinics in the area.  Patient is amenable with this plan, stable this time for outpatient follow-up.      ____________________________________________   FINAL CLINICAL IMPRESSION(S) / ED DIAGNOSES  Final diagnoses:  Pain due to dental caries     ED Discharge Orders         Ordered    amoxicillin-clavulanate (AUGMENTIN) 875-125 MG tablet  Every 12 hours        03/17/21 1725    lidocaine (XYLOCAINE) 2 % solution  Every 4 hours PRN        03/17/21 1725          *Please note:  Craigory Toste was evaluated in Emergency Department on 03/17/2021 for the symptoms described in the history of present illness. He was evaluated in the context of the global COVID-19 pandemic, which necessitated consideration that the patient might be at risk for infection with the SARS-CoV-2 virus that causes COVID-19. Institutional protocols and algorithms that pertain to the evaluation of patients at risk for COVID-19 are in a state of rapid change based on information released by regulatory bodies including the CDC and federal and state organizations. These policies and algorithms were followed during the patient's care in the ED.  Some ED evaluations and interventions may be delayed as a result of limited staffing during and the pandemic.*   Note:  This document was prepared using Dragon voice recognition software and may include unintentional dictation errors.   Lucy Chris, PA 03/17/21 1737    Shaune Pollack, MD 03/20/21 1900

## 2021-03-17 NOTE — ED Triage Notes (Signed)
Pt comes with c/o top right dental pain that has been going on for couple months. Pt states he thinks it was his filling that broke.

## 2021-03-17 NOTE — ED Notes (Signed)
Pt has been provided with discharge instructions. Pt denies any questions or concerns at this time. Pt verbalizes understanding for follow up care and d/c.  VSS.  Pt left department with all belongings.  

## 2021-04-28 ENCOUNTER — Other Ambulatory Visit: Payer: Self-pay

## 2021-04-29 ENCOUNTER — Other Ambulatory Visit: Payer: Self-pay

## 2021-05-11 ENCOUNTER — Other Ambulatory Visit: Payer: Self-pay

## 2021-05-11 MED ORDER — FLUOXETINE HCL 20 MG PO CAPS
ORAL_CAPSULE | ORAL | 1 refills | Status: DC
  Filled 2021-05-11 – 2021-05-24 (×2): qty 30, 30d supply, fill #0

## 2021-05-12 ENCOUNTER — Other Ambulatory Visit: Payer: Self-pay

## 2021-05-13 ENCOUNTER — Ambulatory Visit: Payer: Medicaid Other | Admitting: Pharmacy Technician

## 2021-05-13 ENCOUNTER — Other Ambulatory Visit: Payer: Self-pay

## 2021-05-13 DIAGNOSIS — Z79899 Other long term (current) drug therapy: Secondary | ICD-10-CM

## 2021-05-13 NOTE — Progress Notes (Signed)
Completed [facility] application and contract.  Patient agreed to all terms of the [facility] contract.    Patient approved to receive medication assistance at [facility] until time for re-certification in 2023, and as long as eligibility criteria continues to be met.    Provided patient with community resource material based on his particular needs.    Keyra Virella J. Ashea Winiarski Care Manager [facility]  

## 2021-05-23 ENCOUNTER — Other Ambulatory Visit: Payer: Self-pay

## 2021-05-24 ENCOUNTER — Other Ambulatory Visit: Payer: Self-pay

## 2021-05-25 ENCOUNTER — Other Ambulatory Visit: Payer: Self-pay

## 2021-06-24 ENCOUNTER — Other Ambulatory Visit: Payer: Self-pay

## 2021-06-24 MED ORDER — FLUOXETINE HCL 20 MG PO CAPS
ORAL_CAPSULE | ORAL | 1 refills | Status: AC
  Filled 2021-06-24: qty 30, 30d supply, fill #0
  Filled 2021-08-02: qty 30, 30d supply, fill #1

## 2021-08-02 ENCOUNTER — Other Ambulatory Visit: Payer: Self-pay

## 2021-08-16 ENCOUNTER — Ambulatory Visit: Payer: Self-pay | Admitting: Physician Assistant

## 2021-08-16 ENCOUNTER — Other Ambulatory Visit: Payer: Self-pay

## 2021-08-16 DIAGNOSIS — Z113 Encounter for screening for infections with a predominantly sexual mode of transmission: Secondary | ICD-10-CM

## 2021-08-16 LAB — HM HIV SCREENING LAB: HM HIV Screening: NEGATIVE

## 2021-08-16 LAB — GRAM STAIN

## 2021-08-16 NOTE — Progress Notes (Signed)
Pt here for STD screening.  Gram stain results reviewed, no treatment required.  Pt declined condoms. Elizabeth Haff M Leisa Gault, RN  

## 2021-08-17 ENCOUNTER — Encounter: Payer: Self-pay | Admitting: Physician Assistant

## 2021-08-17 NOTE — Progress Notes (Signed)
[facility] Department STI clinic/screening visit  Subjective:  Luis Bowen is a 40 y.o. male being seen today for an STI screening visit. The patient reports they do not have symptoms.    Patient has the following medical conditions:   Patient Active Problem List   Diagnosis Date Noted   Health maintenance examination 08/25/2015   Coital headache 03/20/2014   Pure hyperglyceridemia 08/14/2011   Factor 5 Leiden mutation, heterozygous (HCC) 07/14/2011   Obesity 07/14/2011   Snoring 07/14/2011     Chief Complaint  Patient presents with   SEXUALLY TRANSMITTED DISEASE    Screening    HPI  Patient reports that he is not having any symptoms but would like a screening today.  States that he has anxiety and depression for which he is in care and taking medicine as prescribed.  Denies any surgeries.  States last HIV test was about 1 year ago and last void prior to sample collection for Gram stain was about 1.5 hr ago.   Screening for MPX risk: Does the patient have an unexplained rash? No Is the patient MSM? No Does the patient endorse multiple sex partners or anonymous sex partners? No Did the patient have close or sexual contact with a person diagnosed with MPX? No Has the patient traveled outside the [place] where MPX is endemic? No Is there a high clinical suspicion for MPX-- evidenced by one of the following No  -Unlikely to be chickenpox  -Lymphadenopathy  -Rash that present in same phase of evolution on any given body part   See flowsheet for further details and programmatic requirements.    The following portions of the patient's history were reviewed and updated as appropriate: allergies, current medications, past medical history, past social history, past surgical history and problem list.  Objective:  There were no vitals filed for this visit.  Physical Exam Constitutional:      General: He is not in acute distress.    Appearance: Normal appearance. He  is obese.  HENT:     Head: Normocephalic and atraumatic.     Comments: No nits,lice, or hair loss. No cervical, supraclavicular or axillary adenopathy.     Mouth/Throat:     Mouth: Mucous membranes are moist.     Pharynx: Oropharynx is clear. No oropharyngeal exudate or posterior oropharyngeal erythema.  Eyes:     Conjunctiva/sclera: Conjunctivae normal.  Pulmonary:     Effort: Pulmonary effort is normal.  Abdominal:     Palpations: Abdomen is soft. There is no mass.     Tenderness: There is no abdominal tenderness. There is no guarding or rebound.  Genitourinary:    Penis: Normal.      Testes: Normal.     Comments: Pubic area without nits, lice, hair loss, edema, erythema, lesions and inguinal adenopathy. Penis circumcised without rash, lesions and discharge at meatus. Testicles descended bilaterally,nt, no masses or edema.  Musculoskeletal:     Cervical back: Neck supple. No tenderness.  Skin:    General: Skin is warm and dry.     Findings: No bruising, erythema, lesion or rash.  Neurological:     Mental Status: He is alert and oriented to person, place, and time.  Psychiatric:        Mood and Affect: Mood normal.        Behavior: Behavior normal.        Thought Content: Thought content normal.        Judgment: Judgment normal.  Assessment and Plan:  Luis Bowen is a 40 y.o. male presenting to the [facility] Department for STI screening  1. Screening for STD (sexually transmitted disease) Patient into clinic without symptoms. Rec condoms with all sex. Await test results.  Counseled that RN will call if needs to RTC for treatment once results are back.  - Gram stain - Gonococcus culture - HIV [place] [facility] - Syphilis Serology, [facility]     No follow-ups on file.  No future appointments.  Matt Holmes, PA

## 2021-08-21 LAB — GONOCOCCUS CULTURE

## 2021-10-03 ENCOUNTER — Other Ambulatory Visit: Payer: Self-pay

## 2022-03-31 ENCOUNTER — Other Ambulatory Visit: Payer: Self-pay

## 2023-08-13 ENCOUNTER — Other Ambulatory Visit: Payer: Self-pay

## 2024-06-06 NOTE — Progress Notes (Signed)
 Sleep Medicine   Office Visit  Patient Name: Luis Bowen DOB: 04/19/1981 MRN [id number]    Chief Complaint: sleep evaluation  Brief History:  [name] presents for an initial consult for sleep evaluation and to establish care. Patient has a longstanding history of excessive daytime sleepiness and snoring. Sleep quality is fair. This is noted most nights. The patient's bed partner reports snoring, gasping, and witnessed apneic pauses at night. The patient relates the following symptoms: choking, occasional headaches, fatigue, trouble concentrating, brain fog, and the inability to initiate sleep are also present. The patient goes to sleep no later than midnight and wakes up at 0800 am and will wake up at least 2 times in between. Patient occasionally has trouble returning to sleep if he is anxious.  Sleep quality is worse when outside home environment.  Patient has noted some significant movement of his legs at night that would disrupt his sleep.  The patient  relates some night terrors, vivid dreams and sleep talking unusual behavior during the night.  The patient relates ADHD, depression, and anxiety as a history of psychiatric problems. The Epworth Sleepiness Score is 18 out of 24 .  The patient relates  Cardiovascular risk factors include: .   ROS  General: (-) fever, (-) chills, (-) night sweat Nose and Sinuses: (-) nasal stuffiness or itchiness, (-) postnasal drip, (-) nosebleeds, (-) sinus trouble. Mouth and Throat: (-) sore throat, (-) hoarseness. Neck: (-) swollen glands, (-) enlarged thyroid, (-) neck pain. Respiratory: - cough, - shortness of breath, - wheezing. Neurologic: - numbness, - tingling. Psychiatric: + anxiety, + depression Sleep behavior: -sleep paralysis -hypnogogic hallucinations -dream enactment      -vivid dreams -cataplexy +night terrors -sleep walking   Current Medication: Outpatient Encounter Medications as of 06/09/2024  Medication Sig   buPROPion (WELLBUTRIN  XL) 150 MG 24 hr tablet Take 150 mg by mouth.   WEGOVY 1 MG/0.5ML SOAJ Inject 1 mg into the skin once a week.   aspirin EC 81 MG tablet Take 81 mg by mouth daily.   FLUoxetine  (PROZAC ) 20 MG capsule Take one capsule by mouth every morning.   [DISCONTINUED] albuterol  (PROVENTIL  HFA;VENTOLIN  HFA) 108 (90 Base) MCG/ACT inhaler Inhale 2 puffs into the lungs every 6 (six) hours as needed for wheezing or shortness of breath.   [DISCONTINUED] aspirin 81 MG tablet Take 81 mg by mouth daily.   [DISCONTINUED] fenofibrate  (TRICOR ) 145 MG tablet Take 1 tablet (145 mg total) by mouth daily.   [DISCONTINUED] lidocaine  (XYLOCAINE ) 2 % solution Use as directed 15 mLs in the mouth or throat every 4 (four) hours as needed for mouth pain. (Patient not taking: Reported on 08/16/2021)   No facility-administered encounter medications on file as of 06/09/2024.    Surgical History: History reviewed. No pertinent surgical history.  Medical History: Past Medical History:  Diagnosis Date   ADHD (attention deficit hyperactivity disorder)    Coital headache 03/20/2014   Depression    Factor 5 Leiden mutation, heterozygous (HCC)    Hyperlipidemia    Obese    PTSD (post-traumatic stress disorder)     Family History: Non contributory to the present illness  Social History: Social History   Socioeconomic History   Marital status: Married    Spouse name: Not on file   Number of children: 0   Years of education: MA   Highest education level: Master's degree (e.g., MA, MS, MEng, MEd, MSW, MBA)  Occupational History   Occupation: Architect  Tobacco  Use   Smoking status: Former    Current packs/day: 0.00    Average packs/day: 0.1 packs/day for 1 year (0.1 ttl pk-yrs)    Types: Cigarettes    Start date: 07/13/2002    Quit date: 07/14/2003    Years since quitting: 20.9   Smokeless tobacco: Never   Tobacco comments:    only smoked 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 He has good social support, has hobbies, and owns a small business.    Social Drivers of Corporate investment banker Strain: Not on file  Food Insecurity: Not on file  Transportation Needs: Not on file  Physical Activity: Not on file  Stress: Not on file  Social Connections: Unknown (01/12/2021)   Social Connection and Isolation Panel    Frequency of Communication with Friends and Family: More than three times a week    Frequency of Social Gatherings with Friends and Family: More than three times a week    Attends Religious Services: Not on file    Active Member of Clubs or Organizations: Not on file    Attends Banker Meetings: Not on file    Marital Status: Married  Intimate Partner Violence: Not At Risk (09/17/2020)   Humiliation, Afraid, Rape, and Kick questionnaire    Fear of Current or Ex-Partner: No    Emotionally Abused: No    Physically Abused: No    Sexually Abused: No    Vital Signs: Blood pressure (!) 163/80, pulse 85, resp. rate 16, height 5' 9 (1.753 m), weight (!) 366 lb (166 kg), SpO2 96%. Body mass index is 54.05 kg/m.   Examination: General Appearance: The patient is well-developed, well-nourished, and in no distress. Neck Circumference: 47 cm Skin: Gross inspection of skin unremarkable. Head: normocephalic, no gross deformities. Eyes: no gross deformities noted. ENT: ears appear grossly normal Neurologic: Alert and oriented. No involuntary movements.    STOP BANG RISK ASSESSMENT S (snore) Have you been told that you snore?     YES   T (tired) Are you often tired, fatigued, or sleepy  during the day?   YES  O (obstruction) Do you stop breathing, choke, or gasp during sleep? YES   P (pressure) Do you have or are you being treated for high blood pressure? NO   B (BMI) Is your body index greater than 35 kg/m? YES   A (age) Are you 43 years old or older? NO   N (neck) Do you have a neck circumference greater than 16 inches?   YES   G (gender) Are you a male? YES   TOTAL STOP/BANG "YES" ANSWERS 7  A STOP-Bang score of 2 or less is considered low risk, and a score of 5 or more is high risk for having either moderate or severe OSA. For people who score 3 or 4, doctors may need to perform further assessment to determine how likely they are to have OSA.         EPWORTH SLEEPINESS SCALE:  Scale:  (0)= no chance of dozing; (1)= slight chance of dozing; (2)= moderate chance of dozing; (3)= high chance of dozing  Chance  Situtation    Sitting and reading: 3    Watching TV: 2    Sitting Inactive in public: 2    As a passenger in car: 3      Lying down to rest: 3    Sitting and talking: 1    Sitting quielty after lunch: 3    In a car, stopped in traffic: 1   TOTAL SCORE:   18 out of 24    SLEEP STUDIES:  None   LABS: No results found for this or any previous visit (from the past 2160 hours).  Radiology: No results found.  No results found.  No results found.    Assessment and Plan: Patient Active Problem List   Diagnosis Date Noted   Witnessed episode of apnea 06/09/2024   Morbid obesity (HCC) 06/09/2024   Health maintenance examination 08/25/2015   Coital headache 03/20/2014   Pure hyperglyceridemia 08/14/2011   Factor 5 Leiden mutation, heterozygous (HCC) 07/14/2011   Obesity 07/14/2011   Snoring 07/14/2011   1. Witnessed episode of apnea (Primary) Patient evaluation suggests high risk of sleep disordered breathing due to witnessed apnea, snoring, gasping, headaches,  daytime sleepiness, morbid obesitySuggest: PSG  to assess/treat the patient's sleep disordered breathing. The patient was also counselled on weight loss to optimize sleep health.   2. Morbid obesity (HCC) Obesity Counseling: Had a lengthy discussion regarding patients BMI and weight issues. Patient was instructed on portion control as well as increased activity. Also discussed caloric restrictions with trying to maintain intake less than 2000 Kcal. Discussions were made in accordance with the 5As of weight management. Simple actions such as not eating late and if able to, taking a walk is suggested.     General Counseling: I have discussed the findings of the evaluation and examination with [place].  I have also discussed any further diagnostic evaluation thatmay be needed or ordered today. Matteus verbalizes understanding of the findings of todays visit. We also reviewed his medications today and discussed drug interactions and side effects including but not limited excessive drowsiness and altered mental states. We also discussed that there is always a risk not just to him but also people around him. he has been encouraged to call the office with any questions or concerns that should arise related to todays visit.  No orders of the defined types were placed in this encounter.       I have personally obtained a history, evaluated the patient, evaluated pertinent data, formulated the assessment and plan and placed orders.   This patient was seen today by Lauraine Lay, PA-C in collaboration with Dr. Elfreda Bathe.   Elfreda DELENA Bathe, MD [facility] Diplomate ABMS Pulmonary and Critical Care Medicine Sleep medicine

## 2024-06-09 ENCOUNTER — Ambulatory Visit (INDEPENDENT_AMBULATORY_CARE_PROVIDER_SITE_OTHER): Payer: MEDICAID | Admitting: Internal Medicine

## 2024-06-09 VITALS — BP 163/80 | HR 85 | Resp 16 | Ht 69.0 in | Wt 366.0 lb

## 2024-06-09 DIAGNOSIS — R0681 Apnea, not elsewhere classified: Secondary | ICD-10-CM | POA: Diagnosis not present

## 2024-06-09 NOTE — Patient Instructions (Signed)

## 2024-06-17 NOTE — Telephone Encounter (Signed)
 I called patient to discuss his sleep study results. AHI was 101.6. Sleep study results suggest he use a sleep aid such as Ambien or Lunesta for PAP titration. Patient states he does well with Melatonin and sleep specialist agreed he could take Melatonin.
